# Patient Record
Sex: Female | Born: 1980 | Race: White | Hispanic: No | Marital: Married | Smoking: Former smoker
Health system: Southern US, Community
[De-identification: ages and names within clinical notes are randomized; demographics above are authoritative.]

## PROBLEM LIST (undated history)

## (undated) DIAGNOSIS — K5792 Diverticulitis of intestine, part unspecified, without perforation or abscess without bleeding: Secondary | ICD-10-CM

## (undated) DIAGNOSIS — E785 Hyperlipidemia, unspecified: Secondary | ICD-10-CM

## (undated) HISTORY — PX: WISDOM TOOTH EXTRACTION: SHX21

## (undated) HISTORY — DX: Diverticulitis of intestine, part unspecified, without perforation or abscess without bleeding: K57.92

---

## 2005-05-02 ENCOUNTER — Ambulatory Visit: Payer: Self-pay | Admitting: Obstetrics and Gynecology

## 2005-06-09 ENCOUNTER — Inpatient Hospital Stay: Payer: Self-pay | Admitting: Obstetrics and Gynecology

## 2006-04-07 ENCOUNTER — Emergency Department: Payer: Self-pay | Admitting: Internal Medicine

## 2007-12-16 ENCOUNTER — Encounter: Payer: Self-pay | Admitting: Maternal & Fetal Medicine

## 2008-03-02 ENCOUNTER — Encounter: Payer: Self-pay | Admitting: Maternal & Fetal Medicine

## 2008-05-31 ENCOUNTER — Inpatient Hospital Stay: Payer: Self-pay

## 2010-01-02 ENCOUNTER — Emergency Department: Payer: Self-pay | Admitting: Emergency Medicine

## 2010-11-03 ENCOUNTER — Emergency Department: Payer: Self-pay | Admitting: Emergency Medicine

## 2013-12-06 ENCOUNTER — Ambulatory Visit: Payer: Self-pay | Admitting: Family Medicine

## 2013-12-06 LAB — URINALYSIS, COMPLETE
Bilirubin,UR: NEGATIVE
Glucose,UR: NEGATIVE mg/dL (ref 0–75)
KETONE: NEGATIVE
Leukocyte Esterase: NEGATIVE
Nitrite: NEGATIVE
PH: 7 (ref 4.5–8.0)
SPECIFIC GRAVITY: 1.01 (ref 1.003–1.030)
WBC UR: NONE SEEN /HPF (ref 0–5)

## 2013-12-06 LAB — PREGNANCY, URINE: Pregnancy Test, Urine: NEGATIVE m[IU]/mL

## 2013-12-08 LAB — URINE CULTURE

## 2014-03-26 ENCOUNTER — Ambulatory Visit: Payer: Self-pay

## 2014-05-05 LAB — HM PAP SMEAR: HM Pap smear: NEGATIVE

## 2015-01-14 ENCOUNTER — Telehealth: Payer: Self-pay

## 2015-01-14 NOTE — Telephone Encounter (Signed)
-----   Message from Myrtis Hopping sent at 01/11/2015 10:40 AM EDT ----- Regarding: Patient requesting call from nursing services From: Samuella Cota Sent: 12/23/2014  Consultation: 12/16/2014  pt said Lorenza Burton wants her to sch. a colonoscopy [phone]

## 2015-01-14 NOTE — Telephone Encounter (Signed)
LVM for pt to return my call.

## 2015-01-18 ENCOUNTER — Other Ambulatory Visit: Payer: Self-pay

## 2015-01-18 NOTE — Telephone Encounter (Signed)
Pt scheduled for colonoscopy at [facility] on 01-21-15. Instructs/rx given.

## 2015-03-20 ENCOUNTER — Other Ambulatory Visit: Payer: Self-pay | Admitting: Unknown Physician Specialty

## 2015-04-05 ENCOUNTER — Other Ambulatory Visit: Payer: Self-pay | Admitting: Unknown Physician Specialty

## 2015-04-05 ENCOUNTER — Telehealth: Payer: Self-pay | Admitting: Unknown Physician Specialty

## 2015-04-05 MED ORDER — NORETHINDRONE ACET-ETHINYL EST 1-20 MG-MCG PO TABS: 1.0000 | ORAL_TABLET | Freq: Every day | ORAL | Status: DC

## 2015-04-05 NOTE — Telephone Encounter (Signed)
Patient called and need refill on Rx: GILDESS 1/20 1-20 MG-MCG tablet sent to [facility] in [place].

## 2015-04-05 NOTE — Telephone Encounter (Signed)
Routing to provider  

## 2015-05-02 ENCOUNTER — Other Ambulatory Visit: Payer: Self-pay | Admitting: Unknown Physician Specialty

## 2015-05-25 ENCOUNTER — Other Ambulatory Visit: Payer: Self-pay | Admitting: Unknown Physician Specialty

## 2015-06-15 ENCOUNTER — Other Ambulatory Visit: Payer: Self-pay | Admitting: Unknown Physician Specialty

## 2015-07-06 ENCOUNTER — Other Ambulatory Visit: Payer: Self-pay | Admitting: Unknown Physician Specialty

## 2015-07-16 ENCOUNTER — Emergency Department
Admission: EM | Admit: 2015-07-16 | Discharge: 2015-07-16 | Disposition: A | Discharge status: Home / Self Care | Payer: Self-pay | Attending: Emergency Medicine | Admitting: Emergency Medicine

## 2015-07-16 ENCOUNTER — Encounter: Payer: Self-pay | Admitting: Emergency Medicine

## 2015-07-16 DIAGNOSIS — J029 Acute pharyngitis, unspecified: Secondary | ICD-10-CM | POA: Insufficient documentation

## 2015-07-16 DIAGNOSIS — Z79899 Other long term (current) drug therapy: Secondary | ICD-10-CM | POA: Insufficient documentation

## 2015-07-16 LAB — POCT RAPID STREP A: Streptococcus, Group A Screen (Direct): NEGATIVE

## 2015-07-16 MED ORDER — IBUPROFEN 800 MG PO TABS: 800.0000 mg | ORAL_TABLET | Freq: Three times a day (TID) | ORAL | Status: DC | PRN

## 2015-07-16 MED ORDER — AZITHROMYCIN 250 MG PO TABS: ORAL_TABLET | ORAL | Status: DC

## 2015-07-16 NOTE — ED Notes (Signed)
Pt presents with sore throat for approximately one week. Pt states she has also had rhinorrhea. Pt states she has had fever up to 100. Pt reports having body aches.

## 2015-07-16 NOTE — ED Notes (Signed)
Sore throat fever and headache body aches since weds

## 2015-07-16 NOTE — ED Provider Notes (Signed)
[facility] Emergency Department Provider Note  ____________________________________________  Time seen: Approximately 2:22 PM  I have reviewed the triage vital signs and the nursing notes.   HISTORY  Chief Complaint Sore Throat    HPI Jene L Kleinsasser is a 34 y.o. female who presents for evaluation of sore throat for one week patient. States that she's had rhinorrhea and bodyaches and fever last night of 100F.   History reviewed. No pertinent past medical history.  There are no active problems to display for this patient.   History reviewed. No pertinent past surgical history.  Current Outpatient Rx  Name  Route  Sig  Dispense  Refill  . azithromycin (ZITHROMAX Z-PAK) 250 MG tablet      Take 2 tablets (500 mg) on  Day 1,  followed by 1 tablet (250 mg) once daily on Days 2 through 5.   6 each   0   . ibuprofen (ADVIL,MOTRIN) 800 MG tablet   Oral   Take 1 tablet (800 mg total) by mouth every 8 (eight) hours as needed.   30 tablet   0   . MICROGESTIN 1-20 MG-MCG tablet      TAKE ONE TABLET BY MOUTH ONCE DAILY   21 tablet   1     Allergies Review of patient's allergies indicates no known allergies.  No family history on file.  Social History Social History  Substance Use Topics  . Smoking status: Never Smoker   . Smokeless tobacco: None  . Alcohol Use: Yes    Review of Systems Constitutional: No fever/chills Eyes: No visual changes. ENT: No sore throat. Cardiovascular: Denies chest pain. Respiratory: Denies shortness of breath. Gastrointestinal: No abdominal pain.  No nausea, no vomiting.  No diarrhea.  No constipation. Genitourinary: Negative for dysuria. Musculoskeletal: Negative for back pain. Skin: Negative for rash. Neurological: Negative for headaches, focal weakness or numbness.  10-point ROS otherwise negative.  ____________________________________________   PHYSICAL EXAM:  VITAL SIGNS: ED Triage Vitals  Enc  Vitals Group     BP 07/16/15 1415 139/91 mmHg     Pulse Rate 07/16/15 1415 102     Resp 07/16/15 1415 20     Temp 07/16/15 1415 98.4 F (36.9 C)     Temp src --      SpO2 07/16/15 1415 98 %     Weight 07/16/15 1415 197 lb (89.359 kg)     Height 07/16/15 1415  (1.626 m)     Head Cir --      Peak Flow --      Pain Score 07/16/15 1415 7     Pain Loc --      Pain Edu? --      Excl. in GC? --     Constitutional: Alert and oriented. Well appearing and in no acute distress. Eyes: Conjunctivae are normal. PERRL. EOMI. Head: Atraumatic. Nose: No congestion/rhinnorhea. Mouth/Throat: Mucous membranes are moist.  Oropharynx erythematous with tonsillar exudate noted. Neck: No stridor.  Positive anterior cervical adenopathy noted Cardiovascular: Normal rate, regular rhythm. Grossly normal heart sounds.  Good peripheral circulation. Respiratory: Normal respiratory effort.  No retractions. Lungs CTAB. Gastrointestinal: Soft and nontender. No distention. No abdominal bruits. No CVA tenderness. Musculoskeletal: No lower extremity tenderness nor edema.  No joint effusions. Neurologic:  Normal speech and language. No gross focal neurologic deficits are appreciated. No gait instability. Skin:  Skin is warm, dry and intact. No rash noted. Psychiatric: Mood and affect are normal. Speech and behavior  are normal.  ____________________________________________   LABS (all labs ordered are listed, but only abnormal results are displayed)  Labs Reviewed  POCT RAPID STREP A     RADIOLOGY  Deferred at this visit ____________________________________________   PROCEDURES  Procedure(s) performed: None  Critical Care performed: No  ____________________________________________   INITIAL IMPRESSION / ASSESSMENT AND PLAN / ED COURSE  Pertinent labs & imaging results that were available during my care of the patient were reviewed by me and considered in my medical decision making (see chart  for details).  Acute pharyngitis. Rx given for Zithromax and Motrin 800. Patient follow-up with PCP or return to the ER with any worsening symptomology work excuse given times one day. ____________________________________________   FINAL CLINICAL IMPRESSION(S) / ED DIAGNOSES  Final diagnoses:  Acute pharyngitis, unspecified pharyngitis type      Evangeline Dakinharles M Sayge Brienza, PA-C 07/16/15 1452  Jennye MoccasinBrian S Quigley, MD 07/16/15 [phone]

## 2015-07-16 NOTE — Discharge Instructions (Signed)

## 2015-07-19 LAB — CULTURE, GROUP A STREP (THRC)

## 2015-08-21 ENCOUNTER — Other Ambulatory Visit: Payer: Self-pay | Admitting: Unknown Physician Specialty

## 2015-08-23 NOTE — Telephone Encounter (Signed)
Needs PE

## 2015-08-23 NOTE — Telephone Encounter (Signed)
Called patient to schedule appt, no answer, left VM for pt to return call and schedule appt for med fu

## 2015-08-24 NOTE — Telephone Encounter (Signed)
Called and left patient a voicemail asking for her to please return my call to schedule a visit.

## 2015-08-25 NOTE — Telephone Encounter (Signed)
Called and left patient a voicemail asking for her to please return my call.  

## 2015-08-26 NOTE — Telephone Encounter (Signed)
We have tried to reach this patient 3 times so I am sending her a letter asking for her to please call to schedule a follow-up.

## 2015-09-06 DIAGNOSIS — G43009 Migraine without aura, not intractable, without status migrainosus: Secondary | ICD-10-CM | POA: Insufficient documentation

## 2015-09-06 DIAGNOSIS — K5792 Diverticulitis of intestine, part unspecified, without perforation or abscess without bleeding: Secondary | ICD-10-CM

## 2015-09-06 DIAGNOSIS — Z3041 Encounter for surveillance of contraceptive pills: Secondary | ICD-10-CM

## 2015-09-07 ENCOUNTER — Encounter: Payer: Self-pay | Admitting: Unknown Physician Specialty

## 2015-09-07 ENCOUNTER — Ambulatory Visit (INDEPENDENT_AMBULATORY_CARE_PROVIDER_SITE_OTHER): Payer: Self-pay | Admitting: Unknown Physician Specialty

## 2015-09-07 VITALS — BP 131/87 | HR 81 | Temp 98.2°F | Ht 61.8 in | Wt 198.6 lb

## 2015-09-07 DIAGNOSIS — Z23 Encounter for immunization: Secondary | ICD-10-CM

## 2015-09-07 MED ORDER — NORETHINDRONE ACET-ETHINYL EST 1-20 MG-MCG PO TABS: 1.0000 | ORAL_TABLET | Freq: Every day | ORAL | Status: DC

## 2015-09-07 NOTE — Progress Notes (Signed)
++++++++++++++++++++++++++++++++++++  BP 131/87 mmHg  Pulse 81  Temp(Src) 98.2 F (36.8 C)  Ht 5' 1.8" (1.57 m)  Wt 198 lb 9.6 oz (90.084 kg)  BMI 36.55 kg/m2  SpO2 98%  LMP 07/28/2014 (Approximate)   Subjective:    Patient ID: Tracy Nunez, female    DOB: 1981-03-06, 35 y.o.   MRN: [id number]  HPI: Tracy Nunez is a 35 y.o. female  Chief Complaint  Patient presents with  . Medication Refill    pt states she needs a refill on BCP   Depression screen PHQ 2/9 09/07/2015  Decreased Interest 0  Down, Depressed, Hopeless 0  PHQ - 2 Score 0    Past Medical History  Diagnosis Date  . Diverticulitis    Family History  Problem Relation Age of Onset  . Cancer Mother     hodgkins lymphoma  . Heart disease Father     massive MI  . Diabetes Father   . Hypertension Father   . Migraines Father   . COPD Father   . Emphysema Father   . Migraines Sister   . Migraines Brother   . Stroke Maternal Grandmother   . Migraines Daughter   . Migraines Son   . Migraines Brother   . Migraines Brother   . Migraines Brother     Relevant past medical, surgical, family and social history reviewed and updated as indicated. Interim medical history since our last visit reviewed. Allergies and medications reviewed and updated.  Review of Systems  Constitutional: Negative.   HENT: Negative.   Eyes: Negative.   Respiratory: Negative.   Cardiovascular: Negative.   Gastrointestinal: Negative.   Endocrine: Negative.   Genitourinary: Negative.        Continuous BCPs.  No menses   Musculoskeletal: Negative.   Skin: Negative.   Allergic/Immunologic: Negative.   Neurological: Negative.   Hematological: Negative.   Psychiatric/Behavioral: Negative.     Per HPI unless specifically indicated above     Objective:    BP 131/87 mmHg  Pulse 81  Temp(Src) 98.2 F (36.8 C)  Ht 5' 1.8" (1.57 m)  Wt 198 lb 9.6 oz (90.084 kg)  BMI 36.55 kg/m2  SpO2 98%  LMP 07/28/2014 (Approximate)  Wt  Readings from Last 3 Encounters:  09/07/15 198 lb 9.6 oz (90.084 kg)  11/20/14 207 lb (93.895 kg)  07/16/15 197 lb (89.359 kg)    Physical Exam  Constitutional: She is oriented to person, place, and time. She appears well-developed and well-nourished.  HENT:  Head: Normocephalic and atraumatic.  Eyes: Pupils are equal, round, and reactive to light. Right eye exhibits no discharge. Left eye exhibits no discharge. No scleral icterus.  Neck: Normal range of motion. Neck supple. Carotid bruit is not present. No thyromegaly present.  Cardiovascular: Normal rate, regular rhythm and normal heart sounds.  Exam reveals no gallop and no friction rub.   No murmur heard. Pulmonary/Chest: Effort normal and breath sounds normal. No respiratory distress. She has no wheezes. She has no rales.  Abdominal: Soft. Bowel sounds are normal. There is no tenderness. There is no rebound.  Genitourinary: No breast swelling, tenderness or discharge.  Musculoskeletal: Normal range of motion.  Lymphadenopathy:    She has no cervical adenopathy.  Neurological: She is alert and oriented to person, place, and time.  Skin: Skin is warm, dry and intact. No rash noted.  Psychiatric: She has a normal mood and affect. Her speech is normal and behavior is normal. Judgment and thought  content normal. Cognition and memory are normal.    Results for orders placed or performed in visit on 09/06/15  HM PAP SMEAR  Result Value Ref Range   HM Pap smear negative- from PP       Assessment & Plan:   Problem List Items Addressed This Visit    None    Visit Diagnoses    Need for diphtheria-tetanus-pertussis (Tdap) vaccine, adult/adolescent    -  Primary    Relevant Orders    Tdap vaccine greater than or equal to 7yo IM (Completed)    Immunization due        Relevant Orders    Flu Vaccine QUAD 36+ mos IM (Completed)        Follow up plan: Return in about 1 year (around 09/06/2016) for physical.

## 2016-08-26 ENCOUNTER — Other Ambulatory Visit: Payer: Self-pay | Admitting: Unknown Physician Specialty

## 2016-08-28 NOTE — Telephone Encounter (Signed)
Pt needs to be seen

## 2016-08-28 NOTE — Telephone Encounter (Signed)
Patient has appointment scheduled for 09/08/16

## 2016-09-08 ENCOUNTER — Encounter: Payer: Self-pay | Admitting: Unknown Physician Specialty

## 2016-10-08 ENCOUNTER — Other Ambulatory Visit: Payer: Self-pay | Admitting: Unknown Physician Specialty

## 2016-10-09 ENCOUNTER — Telehealth: Payer: Self-pay | Admitting: Unknown Physician Specialty

## 2016-10-09 MED ORDER — NORETHINDRONE ACET-ETHINYL EST 1-20 MG-MCG PO TABS: 1.0000 | ORAL_TABLET | Freq: Every day | ORAL | 1 refills | Status: DC

## 2016-10-09 NOTE — Telephone Encounter (Signed)
Patient is completely out of her BC med --microgestin 1-20mg  mcg and needs refill sent in if possible.  She has a CPE on 10/13/2016 @ 9.     [facility] [place]   Thank You

## 2016-10-09 NOTE — Telephone Encounter (Signed)
Routing to provider. Patient has appointment scheduled 10/13/16.

## 2016-10-13 ENCOUNTER — Encounter: Payer: Self-pay | Admitting: Unknown Physician Specialty

## 2016-10-13 ENCOUNTER — Ambulatory Visit (INDEPENDENT_AMBULATORY_CARE_PROVIDER_SITE_OTHER): Payer: Self-pay | Admitting: Unknown Physician Specialty

## 2016-10-13 VITALS — BP 125/84 | HR 98 | Temp 98.2°F | Ht 63.0 in | Wt 207.1 lb

## 2016-10-13 DIAGNOSIS — E669 Obesity, unspecified: Secondary | ICD-10-CM

## 2016-10-13 DIAGNOSIS — Z0000 Encounter for general adult medical examination without abnormal findings: Secondary | ICD-10-CM

## 2016-10-13 MED ORDER — NORETHINDRONE ACET-ETHINYL EST 1-20 MG-MCG PO TABS: 1.0000 | ORAL_TABLET | Freq: Every day | ORAL | 12 refills | Status: DC

## 2016-10-13 NOTE — Progress Notes (Signed)
   BP 125/84 (BP Location: Left Arm, Patient Position: Sitting, Cuff Size: Large)   Pulse 98   Temp 98.2 F (36.8 C)   Ht 5\' 3"  (1.6 m)   Wt 207 lb 1.6 oz (93.9 kg)   LMP  (LMP Unknown)   SpO2 100%   BMI 36.69 kg/m    Subjective:    Patient ID: Tracy Nunez, female    DOB: 02/07/1981, 36 y.o.   MRN: [id number]  HPI: Tracy Nunez is a 36 y.o. female  Chief Complaint  Patient presents with  . Annual Exam    Relevant past medical, surgical, family and social history reviewed and updated as indicated. Interim medical history since our last visit reviewed. Allergies and medications reviewed and updated.  Review of Systems  Per HPI unless specifically indicated above     Objective:    BP 125/84 (BP Location: Left Arm, Patient Position: Sitting, Cuff Size: Large)   Pulse 98   Temp 98.2 F (36.8 C)   Ht 5\' 3"  (1.6 m)   Wt 207 lb 1.6 oz (93.9 kg)   LMP  (LMP Unknown)   SpO2 100%   BMI 36.69 kg/m   Wt Readings from Last 3 Encounters:  10/13/16 207 lb 1.6 oz (93.9 kg)  09/07/15 198 lb 9.6 oz (90.1 kg)  11/20/14 207 lb (93.9 kg)    Physical Exam  Constitutional: She is oriented to person, place, and time. She appears well-developed and well-nourished.  HENT:  Head: Normocephalic and atraumatic.  Eyes: Pupils are equal, round, and reactive to light. Right eye exhibits no discharge. Left eye exhibits no discharge. No scleral icterus.  Neck: Normal range of motion. Neck supple. Carotid bruit is not present. No thyromegaly present.  Cardiovascular: Normal rate, regular rhythm and normal heart sounds.  Exam reveals no gallop and no friction rub.   No murmur heard. Pulmonary/Chest: Effort normal and breath sounds normal. No respiratory distress. She has no wheezes. She has no rales.  Abdominal: Soft. Bowel sounds are normal. There is no tenderness. There is no rebound.  Genitourinary: No breast swelling, tenderness or discharge.  Musculoskeletal: Normal range of motion.    Lymphadenopathy:    She has no cervical adenopathy.  Neurological: She is alert and oriented to person, place, and time.  Skin: Skin is warm, dry and intact. No rash noted.  Psychiatric: She has a normal mood and affect. Her speech is normal and behavior is normal. Judgment and thought content normal. Cognition and memory are normal.      Assessment & Plan:   Problem List Items Addressed This Visit      Unprioritized   Obesity (BMI 35.0-39.9 without comorbidity)    Log calories and activity into "My Fitness Pal."  Diet review shows she eats well.  Consider checking thyroid       Other Visit Diagnoses    Routine general medical examination at a health care facility    -  Primary       Follow up plan: Return in about 1 year (around 10/13/2017).

## 2016-10-13 NOTE — Assessment & Plan Note (Addendum)
Log calories and activity into "My Fitness Pal."  Diet review shows she eats well.  Consider checking thyroid

## 2016-11-07 ENCOUNTER — Encounter: Payer: Self-pay | Admitting: Unknown Physician Specialty

## 2017-10-15 ENCOUNTER — Encounter: Payer: Self-pay | Admitting: Unknown Physician Specialty

## 2017-10-20 ENCOUNTER — Other Ambulatory Visit: Payer: Self-pay

## 2017-10-20 ENCOUNTER — Ambulatory Visit
Admission: EM | Admit: 2017-10-20 | Discharge: 2017-10-20 | Disposition: A | Discharge status: Home / Self Care | Payer: Self-pay | Attending: Family Medicine | Admitting: Family Medicine

## 2017-10-20 ENCOUNTER — Encounter: Payer: Self-pay | Admitting: Gynecology

## 2017-10-20 DIAGNOSIS — J029 Acute pharyngitis, unspecified: Secondary | ICD-10-CM

## 2017-10-20 DIAGNOSIS — R6883 Chills (without fever): Secondary | ICD-10-CM

## 2017-10-20 DIAGNOSIS — R509 Fever, unspecified: Secondary | ICD-10-CM

## 2017-10-20 DIAGNOSIS — J020 Streptococcal pharyngitis: Secondary | ICD-10-CM

## 2017-10-20 LAB — RAPID INFLUENZA A&B ANTIGENS (ARMC ONLY): INFLUENZA B (ARMC): NEGATIVE

## 2017-10-20 LAB — RAPID INFLUENZA A&B ANTIGENS: Influenza A (ARMC): NEGATIVE

## 2017-10-20 LAB — RAPID STREP SCREEN (MED CTR MEBANE ONLY): Streptococcus, Group A Screen (Direct): POSITIVE — AB

## 2017-10-20 MED ORDER — AMOXICILLIN 500 MG PO TABS: 500.0000 mg | ORAL_TABLET | Freq: Two times a day (BID) | ORAL | 0 refills | Status: DC

## 2017-10-20 NOTE — ED Triage Notes (Signed)
Per patient c/o ear pain/ sore throat / feve x 3 days.

## 2017-10-20 NOTE — Discharge Instructions (Signed)
Rest.  Tylenol and Ibuprofen as needed.  Antibiotic as prescribed.  Dr. Adriana Simasook

## 2017-10-20 NOTE — ED Provider Notes (Signed)
[facility]  CSN: [id number] Arrival date & time: 10/20/17  0913  History   Chief Complaint Chief Complaint  Patient presents with  . Sore Throat  . Otalgia   HPI  37 year old female presents with fever, chills, sore throat, ear pain.  Patient states that she is been sick since Thursday.  Started abruptly with fever and chills.  She continues to have fever and chills.  Also has associated ear pain, headache, neck pain, and sore throat.  Reports body aches as well.  She is also had a bout of nausea and vomiting.  She is taken over-the-counter DayQuil and TheraFlu without improvement.  No known exacerbating factors.  No recent sick contacts.  No other associated symptoms.  No other complaints.  Past Medical History:  Diagnosis Date  . Diverticulitis    Patient Active Problem List   Diagnosis Date Noted  . Obesity (BMI 35.0-39.9 without comorbidity) 10/13/2016  . Encounter for surveillance of contraceptive pills 09/06/2015  . Migraine headache without aura 09/06/2015  . Diverticulitis 09/06/2015   OB History    No data available     Home Medications    Prior to Admission medications   Medication Sig Start Date End Date Taking? Authorizing Provider  Multiple Vitamin (MULTIVITAMIN) tablet Take 1 tablet by mouth daily.   Yes [provider]  amoxicillin (AMOXIL) 500 MG tablet Take 1 tablet (500 mg total) by mouth 2 (two) times daily. 10/20/17   Tommie Samsook, Faheem Ziemann G, DO    Family History Family History  Problem Relation Age of Onset  . Cancer Mother        hodgkins lymphoma  . Heart disease Father        massive MI  . Diabetes Father   . Hypertension Father   . Migraines Father   . COPD Father   . Emphysema Father   . Migraines Sister   . Migraines Brother   . Stroke Maternal Grandmother   . Migraines Daughter   . Migraines Son   . Migraines Brother   . Migraines Brother   . Migraines Brother     Social History Social History   Tobacco Use  .  Smoking status: Former Smoker    Last attempt to quit: 01/05/2006    Years since quitting: 11.7  . Smokeless tobacco: Never Used  Substance Use Topics  . Alcohol use: No    Comment: pt states maybe once every 3 months   . Drug use: No     Allergies   Patient has no known allergies.   Review of Systems Review of Systems  Constitutional: Positive for chills and fever.  HENT: Positive for ear pain and sore throat.   Musculoskeletal: Positive for neck pain.       Body aches.  Neurological: Positive for headaches.   Physical Exam Triage Vital Signs ED Triage Vitals  Enc Vitals Group     BP 10/20/17 0937 122/76     Pulse Rate 10/20/17 0937 (!) 117     Resp --      Temp 10/20/17 0937 (!) 102.8 F (39.3 C)     Temp Source 10/20/17 0937 Oral     SpO2 10/20/17 0937 100 %     Weight 10/20/17 0939 196 lb (88.9 kg)     Height 10/20/17 0939 5\' 3"  (1.6 m)     Head Circumference --      Peak Flow --      Pain Score 10/20/17 0938 7  Pain Loc --      Pain Edu? --      Excl. in GC? --   Updated Vital Signs BP 122/76 (BP Location: Right Arm)   Pulse (!) 117   Temp (!) 102.8 F (39.3 C) (Oral)   Ht 5\' 3"  (1.6 m)   Wt 196 lb (88.9 kg)   LMP 09/08/2017   SpO2 100%   BMI 34.72 kg/m   Physical Exam  Constitutional: She is oriented to person, place, and time. She appears well-developed. No distress.  HENT:  Head: Normocephalic and atraumatic.  Mouth/Throat: Uvula is midline. Posterior oropharyngeal erythema present. No tonsillar abscesses. Tonsils are 2+ on the right. Tonsils are 2+ on the left. Tonsillar exudate.  TMs normal bilaterally.   Eyes: Conjunctivae are normal. Right eye exhibits no discharge. Left eye exhibits no discharge.  Neck: Neck supple.  Cardiovascular: Regular rhythm.  Tachycardic.  Pulmonary/Chest: Effort normal and breath sounds normal. She has no wheezes. She has no rales.  Lymphadenopathy:    She has cervical adenopathy.  Neurological: She is alert  and oriented to person, place, and time.  Psychiatric: She has a normal mood and affect. Her behavior is normal.  Nursing note and vitals reviewed.  UC Treatments / Results  Labs (all labs ordered are listed, but only abnormal results are displayed) Labs Reviewed  RAPID STREP SCREEN (NOT AT [facility]) - Abnormal; Notable for the following components:      Result Value   Streptococcus, Group A Screen (Direct) POSITIVE (*)    All other components within normal limits  RAPID INFLUENZA A&B ANTIGENS ([facility] ONLY)    EKG  EKG Interpretation None       Radiology No results found.  Procedures Procedures (including critical care time)  Medications Ordered in UC Medications - No data to display   Initial Impression / Assessment and Plan / UC Course  I have reviewed the triage vital signs and the nursing notes.  Pertinent labs & imaging results that were available during my care of the patient were reviewed by me and considered in my medical decision making (see chart for details).    37 year old female presents with strep pharyngitis.  Rapid strep was positive.  Flu is negative.  Treating with amoxicillin.  Ibuprofen and Tylenol as needed.  Supportive care.  Final Clinical Impressions(s) / UC Diagnoses   Final diagnoses:  Strep pharyngitis    ED Discharge Orders        Ordered    amoxicillin (AMOXIL) 500 MG tablet  2 times daily     10/20/17 1014     Controlled Substance Prescriptions [place] Controlled Substance Registry consulted? Not Applicable   Tommie Sams, DO 10/20/17 1016

## 2017-11-06 ENCOUNTER — Encounter: Payer: Self-pay | Admitting: Unknown Physician Specialty

## 2017-11-06 ENCOUNTER — Ambulatory Visit (INDEPENDENT_AMBULATORY_CARE_PROVIDER_SITE_OTHER): Payer: Self-pay | Admitting: Unknown Physician Specialty

## 2017-11-06 VITALS — BP 117/78 | HR 75 | Temp 98.7°F | Ht 61.7 in | Wt 194.6 lb

## 2017-11-06 DIAGNOSIS — Z0000 Encounter for general adult medical examination without abnormal findings: Secondary | ICD-10-CM

## 2017-11-06 NOTE — Patient Instructions (Signed)
Preventive Care 18-39 Years, Female Preventive care refers to lifestyle choices and visits with your health care provider that can promote health and wellness. What does preventive care include?  A yearly physical exam. This is also called an annual well check.  Dental exams once or twice a year.  Routine eye exams. Ask your health care provider how often you should have your eyes checked.  Personal lifestyle choices, including: ? Daily care of your teeth and gums. ? Regular physical activity. ? Eating a healthy diet. ? Avoiding tobacco and drug use. ? Limiting alcohol use. ? Practicing safe sex. ? Taking vitamin and mineral supplements as recommended by your health care provider. What happens during an annual well check? The services and screenings done by your health care provider during your annual well check will depend on your age, overall health, lifestyle risk factors, and family history of disease. Counseling Your health care provider may ask you questions about your:  Alcohol use.  Tobacco use.  Drug use.  Emotional well-being.  Home and relationship well-being.  Sexual activity.  Eating habits.  Work and work Statistician.  Method of birth control.  Menstrual cycle.  Pregnancy history.  Screening You may have the following tests or measurements:  Height, weight, and BMI.  Diabetes screening. This is done by checking your blood sugar (glucose) after you have not eaten for a while (fasting).  Blood pressure.  Lipid and cholesterol levels. These may be checked every 5 years starting at age 66.  Skin check.  Hepatitis C blood test.  Hepatitis B blood test.  Sexually transmitted disease (STD) testing.  BRCA-related cancer screening. This may be done if you have a family history of breast, ovarian, tubal, or peritoneal cancers.  Pelvic exam and Pap test. This may be done every 3 years starting at age 40. Starting at age 59, this may be done every 5  years if you have a Pap test in combination with an HPV test.  Discuss your test results, treatment options, and if necessary, the need for more tests with your health care provider. Vaccines Your health care provider may recommend certain vaccines, such as:  Influenza vaccine. This is recommended every year.  Tetanus, diphtheria, and acellular pertussis (Tdap, Td) vaccine. You may need a Td booster every 10 years.  Varicella vaccine. You may need this if you have not been vaccinated.  HPV vaccine. If you are 69 or younger, you may need three doses over 6 months.  Measles, mumps, and rubella (MMR) vaccine. You may need at least one dose of MMR. You may also need a second dose.  Pneumococcal 13-valent conjugate (PCV13) vaccine. You may need this if you have certain conditions and were not previously vaccinated.  Pneumococcal polysaccharide (PPSV23) vaccine. You may need one or two doses if you smoke cigarettes or if you have certain conditions.  Meningococcal vaccine. One dose is recommended if you are age 27-21 years and a first-year college student living in a residence hall, or if you have one of several medical conditions. You may also need additional booster doses.  Hepatitis A vaccine. You may need this if you have certain conditions or if you travel or work in places where you may be exposed to hepatitis A.  Hepatitis B vaccine. You may need this if you have certain conditions or if you travel or work in places where you may be exposed to hepatitis B.  Haemophilus influenzae type b (Hib) vaccine. You may need this if  you have certain risk factors.  Talk to your health care provider about which screenings and vaccines you need and how often you need them. This information is not intended to replace advice given to you by your health care provider. Make sure you discuss any questions you have with your health care provider. Document Released: 09/19/2001 Document Revised: 04/12/2016  Document Reviewed: 05/25/2015 Elsevier Interactive Patient Education  [employer].

## 2017-11-06 NOTE — Progress Notes (Signed)
BP 117/78   Pulse 75   Temp 98.7 F (37.1 C) (Oral)   Ht 5' 1.7" (1.567 m)   Wt 194 lb 9.6 oz (88.3 kg)   LMP 10/29/2017 (Exact Date)   SpO2 98%   BMI 35.94 kg/m    Subjective:    Patient ID: Tracy Nunez, female    DOB: 06/27/1981, 37 y.o.   MRN: [id number]  HPI: Tracy Nunez is a 37 y.o. female  Chief Complaint  Patient presents with  . Annual Exam   Voluntarily stopped taking her BC.  States she is OK with getting pregnant.  She had a regular period last Monday.    Obesity Steadily losing weight.  She is going to boot camp and running.  Her sister helps motivate her.  Eating better by cutting out soft drinks and rarely eats sweets.    [facility] 11/06/2017 10/13/2016 09/07/2015  Decreased Interest 0 0 0  Down, Depressed, Hopeless 0 0 0  PHQ - 2 Score 0 0 0     Social History   Socioeconomic History  . Marital status: Married    Spouse name: Not on file  . Number of children: Not on file  . Years of education: Not on file  . Highest education level: Not on file  Occupational History  . Not on file  Social Needs  . Financial resource strain: Not on file  . Food insecurity:    Worry: Not on file    Inability: Not on file  . Transportation needs:    Medical: Not on file    Non-medical: Not on file  Tobacco Use  . Smoking status: Former Smoker    Last attempt to quit: 01/05/2006    Years since quitting: 11.8  . Smokeless tobacco: Never Used  Substance and Sexual Activity  . Alcohol use: No    Comment: pt states maybe once every 3 months   . Drug use: No  . Sexual activity: Yes  Lifestyle  . Physical activity:    Days per week: Not on file    Minutes per session: Not on file  . Stress: Not on file  Relationships  . Social connections:    Talks on phone: Not on file    Gets together: Not on file    Attends religious service: Not on file    Active member of club or organization: Not on file    Attends meetings of clubs or organizations: Not  on file    Relationship status: Not on file  . Intimate partner violence:    Fear of current or ex partner: Not on file    Emotionally abused: Not on file    Physically abused: Not on file    Forced sexual activity: Not on file  Other Topics Concern  . Not on file  Social History Narrative  . Not on file   Family History  Problem Relation Age of Onset  . Cancer Mother        hodgkins lymphoma  . Heart disease Father        massive MI  . Diabetes Father   . Hypertension Father   . Migraines Father   . COPD Father   . Emphysema Father   . Migraines Sister   . Migraines Brother   . Stroke Maternal Grandmother   . Migraines Daughter   . Migraines Son   . Migraines Brother   . Migraines Brother   . Migraines Brother  Past Medical History:  Diagnosis Date  . Diverticulitis    History reviewed. No pertinent surgical history.  Relevant past medical, surgical, family and social history reviewed and updated as indicated. Interim medical history since our last visit reviewed. Allergies and medications reviewed and updated.  Review of Systems  Constitutional: Negative.   HENT: Negative.   Eyes: Negative.   Respiratory: Negative.   Cardiovascular: Negative.   Gastrointestinal: Negative.   Endocrine: Negative.   Genitourinary: Negative.   Musculoskeletal: Negative.   Skin: Negative.   Allergic/Immunologic: Negative.   Neurological: Negative.   Hematological: Negative.   Psychiatric/Behavioral: Negative.     Per HPI unless specifically indicated above     Objective:    BP 117/78   Pulse 75   Temp 98.7 F (37.1 C) (Oral)   Ht 5' 1.7" (1.567 m)   Wt 194 lb 9.6 oz (88.3 kg)   LMP 10/29/2017 (Exact Date)   SpO2 98%   BMI 35.94 kg/m   Wt Readings from Last 3 Encounters:  11/06/17 194 lb 9.6 oz (88.3 kg)  10/20/17 196 lb (88.9 kg)  10/13/16 207 lb 1.6 oz (93.9 kg)    Physical Exam  Constitutional: She is oriented to person, place, and time. She appears  well-developed and well-nourished. No distress.  HENT:  Head: Normocephalic and atraumatic.  Eyes: Pupils are equal, round, and reactive to light. Conjunctivae and lids are normal. Right eye exhibits no discharge. Left eye exhibits no discharge. No scleral icterus.  Neck: Normal range of motion. Neck supple. No JVD present. Carotid bruit is not present. No thyromegaly present.  Cardiovascular: Normal rate, regular rhythm and normal heart sounds. Exam reveals no gallop and no friction rub.  No murmur heard. Pulmonary/Chest: Effort normal and breath sounds normal. No respiratory distress. She has no wheezes. She has no rales. No breast swelling, tenderness or discharge.  Abdominal: Soft. Normal appearance and bowel sounds are normal. There is no splenomegaly or hepatomegaly. There is no tenderness. There is no rebound.  Genitourinary: Vagina normal and uterus normal. Cervix exhibits no motion tenderness, no discharge and no friability. Right adnexum displays no mass, no tenderness and no fullness. Left adnexum displays no mass, no tenderness and no fullness.  Musculoskeletal: Normal range of motion.  Lymphadenopathy:    She has no cervical adenopathy.  Neurological: She is alert and oriented to person, place, and time.  Skin: Skin is warm, dry and intact. No rash noted. No pallor.  Psychiatric: She has a normal mood and affect. Her speech is normal and behavior is normal. Judgment and thought content normal. Cognition and memory are normal.    Results for orders placed or performed during the hospital encounter of 10/20/17  Rapid Influenza A&B Antigens ([facility] only)  Result Value Ref Range   Influenza A ([facility]) NEGATIVE NEGATIVE   Influenza B ([facility]) NEGATIVE NEGATIVE  Rapid strep screen  Result Value Ref Range   Streptococcus, Group A Screen (Direct) POSITIVE (A) NEGATIVE      Assessment & Plan:   Problem List Items Addressed This Visit    None    Visit Diagnoses    Annual physical exam     -  Primary   Relevant Orders   Lipid Panel w/o Chol/HDL Ratio   Comprehensive metabolic panel   CBC with Differential/Platelet   TSH   IGP, Aptima HPV, rfx 16/18,45      HM Pap done Td due 2027  Follow up plan: Return in about 1 year (around  11/07/2018).      

## 2017-11-07 ENCOUNTER — Encounter: Payer: Self-pay | Admitting: Unknown Physician Specialty

## 2017-11-07 LAB — CBC WITH DIFFERENTIAL/PLATELET
BASOS ABS: 0 10*3/uL (ref 0.0–0.2)
Basos: 1 %
EOS (ABSOLUTE): 0.2 10*3/uL (ref 0.0–0.4)
Eos: 3 %
Hematocrit: 40.7 % (ref 34.0–46.6)
Hemoglobin: 13.8 g/dL (ref 11.1–15.9)
IMMATURE GRANS (ABS): 0 10*3/uL (ref 0.0–0.1)
IMMATURE GRANULOCYTES: 0 %
LYMPHS: 36 %
Lymphocytes Absolute: 2.5 10*3/uL (ref 0.7–3.1)
MCH: 29.9 pg (ref 26.6–33.0)
MCHC: 33.9 g/dL (ref 31.5–35.7)
MCV: 88 fL (ref 79–97)
MONOS ABS: 0.6 10*3/uL (ref 0.1–0.9)
Monocytes: 9 %
Neutrophils Absolute: 3.7 10*3/uL (ref 1.4–7.0)
Neutrophils: 51 %
PLATELETS: 282 10*3/uL (ref 150–379)
RBC: 4.62 x10E6/uL (ref 3.77–5.28)
RDW: 13.5 % (ref 12.3–15.4)
WBC: 7.1 10*3/uL (ref 3.4–10.8)

## 2017-11-07 LAB — LIPID PANEL W/O CHOL/HDL RATIO
Cholesterol, Total: 134 mg/dL (ref 100–199)
HDL: 37 mg/dL — AB (ref >39)
LDL CALC: 55 mg/dL (ref 0–99)
Triglycerides: 209 mg/dL — ABNORMAL HIGH (ref 0–149)
VLDL Cholesterol Cal: 42 mg/dL — ABNORMAL HIGH (ref 5–40)

## 2017-11-07 LAB — COMPREHENSIVE METABOLIC PANEL
A/G RATIO: 1.5 (ref 1.2–2.2)
ALK PHOS: 92 IU/L (ref 39–117)
ALT: 16 IU/L (ref 0–32)
AST: 16 IU/L (ref 0–40)
Albumin: 4.1 g/dL (ref 3.5–5.5)
BUN/Creatinine Ratio: 22 (ref 9–23)
BUN: 14 mg/dL (ref 6–20)
Bilirubin Total: 0.3 mg/dL (ref 0.0–1.2)
CALCIUM: 9.2 mg/dL (ref 8.7–10.2)
CO2: 27 mmol/L (ref 20–29)
Chloride: 100 mmol/L (ref 96–106)
Creatinine, Ser: 0.65 mg/dL (ref 0.57–1.00)
GFR calc Af Amer: 133 mL/min/{1.73_m2} (ref >59)
GFR, EST NON AFRICAN AMERICAN: 115 mL/min/{1.73_m2} (ref >59)
GLOBULIN, TOTAL: 2.8 g/dL (ref 1.5–4.5)
Glucose: 72 mg/dL (ref 65–99)
POTASSIUM: 4.3 mmol/L (ref 3.5–5.2)
SODIUM: 140 mmol/L (ref 134–144)
Total Protein: 6.9 g/dL (ref 6.0–8.5)

## 2017-11-07 LAB — TSH: TSH: 1.48 u[IU]/mL (ref 0.450–4.500)

## 2017-11-08 LAB — IGP, APTIMA HPV, RFX 16/18,45
HPV APTIMA: NEGATIVE
PAP SMEAR COMMENT: 0

## 2018-11-12 ENCOUNTER — Encounter: Payer: Self-pay | Admitting: Unknown Physician Specialty

## 2018-11-12 ENCOUNTER — Encounter: Payer: Self-pay | Admitting: Nurse Practitioner

## 2018-12-31 DIAGNOSIS — O09512 Supervision of elderly primigravida, second trimester: Secondary | ICD-10-CM | POA: Diagnosis not present

## 2018-12-31 DIAGNOSIS — Z362 Encounter for other antenatal screening follow-up: Secondary | ICD-10-CM | POA: Diagnosis not present

## 2019-01-20 DIAGNOSIS — O99810 Abnormal glucose complicating pregnancy: Secondary | ICD-10-CM | POA: Diagnosis not present

## 2019-01-20 DIAGNOSIS — O09523 Supervision of elderly multigravida, third trimester: Secondary | ICD-10-CM | POA: Diagnosis not present

## 2019-01-20 DIAGNOSIS — O99213 Obesity complicating pregnancy, third trimester: Secondary | ICD-10-CM | POA: Diagnosis not present

## 2019-01-27 DIAGNOSIS — O99810 Abnormal glucose complicating pregnancy: Secondary | ICD-10-CM | POA: Diagnosis not present

## 2019-02-18 DIAGNOSIS — O99810 Abnormal glucose complicating pregnancy: Secondary | ICD-10-CM | POA: Diagnosis not present

## 2019-03-13 DIAGNOSIS — Z79899 Other long term (current) drug therapy: Secondary | ICD-10-CM | POA: Diagnosis not present

## 2019-03-13 DIAGNOSIS — Z3A35 35 weeks gestation of pregnancy: Secondary | ICD-10-CM | POA: Diagnosis not present

## 2019-03-13 DIAGNOSIS — O24419 Gestational diabetes mellitus in pregnancy, unspecified control: Secondary | ICD-10-CM | POA: Diagnosis not present

## 2019-03-17 DIAGNOSIS — O09513 Supervision of elderly primigravida, third trimester: Secondary | ICD-10-CM | POA: Diagnosis not present

## 2019-03-17 DIAGNOSIS — Z3A36 36 weeks gestation of pregnancy: Secondary | ICD-10-CM | POA: Diagnosis not present

## 2019-03-17 DIAGNOSIS — O24410 Gestational diabetes mellitus in pregnancy, diet controlled: Secondary | ICD-10-CM | POA: Diagnosis not present

## 2019-03-17 DIAGNOSIS — O24419 Gestational diabetes mellitus in pregnancy, unspecified control: Secondary | ICD-10-CM | POA: Diagnosis not present

## 2019-04-09 DIAGNOSIS — O24420 Gestational diabetes mellitus in childbirth, diet controlled: Secondary | ICD-10-CM | POA: Diagnosis not present

## 2019-04-09 DIAGNOSIS — Z3A39 39 weeks gestation of pregnancy: Secondary | ICD-10-CM | POA: Diagnosis not present

## 2020-01-09 DIAGNOSIS — R399 Unspecified symptoms and signs involving the genitourinary system: Secondary | ICD-10-CM | POA: Diagnosis not present

## 2020-01-09 DIAGNOSIS — Z30011 Encounter for initial prescription of contraceptive pills: Secondary | ICD-10-CM | POA: Diagnosis not present

## 2020-01-09 DIAGNOSIS — N898 Other specified noninflammatory disorders of vagina: Secondary | ICD-10-CM | POA: Diagnosis not present

## 2020-01-09 DIAGNOSIS — Z3046 Encounter for surveillance of implantable subdermal contraceptive: Secondary | ICD-10-CM | POA: Diagnosis not present

## 2020-05-17 DIAGNOSIS — H5213 Myopia, bilateral: Secondary | ICD-10-CM | POA: Diagnosis not present

## 2020-06-03 DIAGNOSIS — H5213 Myopia, bilateral: Secondary | ICD-10-CM | POA: Diagnosis not present

## 2021-05-02 ENCOUNTER — Other Ambulatory Visit: Payer: Self-pay

## 2021-05-02 ENCOUNTER — Ambulatory Visit
Admission: EM | Admit: 2021-05-02 | Discharge: 2021-05-02 | Disposition: A | Discharge status: Other Acute Inpatient Hospital | Payer: Medicaid Other

## 2021-05-02 DIAGNOSIS — R1032 Left lower quadrant pain: Secondary | ICD-10-CM

## 2021-05-02 NOTE — ED Provider Notes (Signed)
[facility]    CSN: [id number] Arrival date & time: 05/02/21  1828      History   Chief Complaint Chief Complaint  Patient presents with   Abdominal Pain    HPI Tracy Nunez is a 40 y.o. female who woke up with L mid abd pain radiating to LLQ. Feels like when she had diverticulitis in the past. Started having a fever of 101 2 hours ago. She took Tylenol before she came.    Past Medical History:  Diagnosis Date   Diverticulitis     Patient Active Problem List   Diagnosis Date Noted   Obesity (BMI 35.0-39.9 without comorbidity) 10/13/2016   Encounter for surveillance of contraceptive pills 09/06/2015   Migraine headache without aura 09/06/2015   Diverticulitis 09/06/2015    No past surgical history on file.  OB History   No obstetric history on file.      Home Medications    Prior to Admission medications   Medication Sig Start Date End Date Taking? Authorizing Provider  gemfibrozil (LOPID) 600 MG tablet Take 600 mg by mouth 2 (two) times daily. 03/14/21  Yes [provider]  Multiple Vitamin (MULTIVITAMIN) tablet Take 1 tablet by mouth daily.   Yes [provider]  norethindrone (MICRONOR) 0.35 MG tablet Take 1 tablet by mouth every morning. 01/03/21 01/03/22 Yes [provider]    Family History Family History  Problem Relation Age of Onset   Cancer Mother        hodgkins lymphoma   Heart disease Father        massive MI   Diabetes Father    Hypertension Father    Migraines Father    COPD Father    Emphysema Father    Migraines Sister    Migraines Brother    Stroke Maternal Grandmother    Migraines Daughter    Migraines Son    Migraines Brother    Migraines Brother    Migraines Brother     Social History Social History   Tobacco Use   Smoking status: Former    Types: Cigarettes    Quit date: 01/05/2006    Years since quitting: 15.3   Smokeless tobacco: Never  Vaping Use   Vaping Use: Never used   Substance Use Topics   Alcohol use: No    Comment: pt states maybe once every 3 months    Drug use: No     Allergies   Patient has no known allergies.   Review of Systems Review of Systems  Constitutional:  Positive for fever.  Gastrointestinal:  Positive for abdominal pain. Negative for diarrhea and vomiting.  Genitourinary:  Negative for difficulty urinating and dysuria.  Musculoskeletal:  Negative for myalgias.  Skin:  Negative for rash.    Physical Exam Triage Vital Signs ED Triage Vitals  Enc Vitals Group     BP 05/02/21 1930 113/87     Pulse Rate 05/02/21 1930 (!) 106     Resp 05/02/21 1930 18     Temp 05/02/21 1930 99.2 F (37.3 C)     Temp Source 05/02/21 1930 Oral     SpO2 05/02/21 1930 100 %     Weight 05/02/21 1927 215 lb (97.5 kg)     Height 05/02/21 1927 5\' 3"  (1.6 m)     Head Circumference --      Peak Flow --      Pain Score 05/02/21 1927 8     Pain Loc --  Pain Edu? --      Excl. in GC? --    No data found.  Updated Vital Signs BP 113/87 (BP Location: Left Arm)   Pulse (!) 106   Temp 99.2 F (37.3 C) (Oral)   Resp 18   Ht 5\' 3"  (1.6 m)   Wt 215 lb (97.5 kg)   SpO2 100%   BMI 38.09 kg/m   Visual Acuity Right Eye Distance:   Left Eye Distance:   Bilateral Distance:    Right Eye Near:   Left Eye Near:    Bilateral Near:     Physical Exam Constitutional:      General: She is not in acute distress.    Appearance: She is obese. She is not toxic-appearing.  HENT:     Head: Normocephalic.  Eyes:     General: No scleral icterus.    Pupils: Pupils are equal, round, and reactive to light.  Pulmonary:     Effort: Pulmonary effort is normal.  Abdominal:     General: Bowel sounds are decreased.     Palpations: Abdomen is soft. There is no hepatomegaly or mass.     Tenderness: There is abdominal tenderness in the left lower quadrant. There is guarding.  Skin:    General: Skin is warm and dry.     Findings: No rash.  Neurological:      Mental Status: She is alert and oriented to person, place, and time.  Psychiatric:        Mood and Affect: Mood normal.        Behavior: Behavior normal.     UC Treatments / Results  Labs (all labs ordered are listed, but only abnormal results are displayed) Labs Reviewed - No data to display  EKG   Radiology No results found.  Procedures Procedures (including critical care time)  Medications Ordered in UC Medications - No data to display  Initial Impression / Assessment and Plan / UC Course  I have reviewed the triage vital signs and the nursing notes. Has acute LLQ pain. She was sent to ER for work up since we dont have lab or CT.      Final diagnoses:  None   Discharge Instructions   None    ED Prescriptions   None    PDMP not reviewed this encounter.   , Garey Ham 05/02/21 1953

## 2021-05-02 NOTE — ED Triage Notes (Signed)
Pt here with C/O diverticulitis flare up since this morning. Started running a fever 1-2 hours ago.

## 2021-08-25 ENCOUNTER — Other Ambulatory Visit: Payer: Self-pay

## 2021-08-25 ENCOUNTER — Ambulatory Visit
Admission: EM | Admit: 2021-08-25 | Discharge: 2021-08-25 | Disposition: A | Discharge status: Home / Self Care | Payer: Medicaid Other | Attending: Emergency Medicine | Admitting: Emergency Medicine

## 2021-08-25 ENCOUNTER — Ambulatory Visit (INDEPENDENT_AMBULATORY_CARE_PROVIDER_SITE_OTHER): Payer: Medicaid Other

## 2021-08-25 DIAGNOSIS — S92515A Nondisplaced fracture of proximal phalanx of left lesser toe(s), initial encounter for closed fracture: Secondary | ICD-10-CM

## 2021-08-25 MED ORDER — KETOROLAC TROMETHAMINE 60 MG/2ML IM SOLN
60.0000 mg | Freq: Once | INTRAMUSCULAR | Status: AC
  Administered 2021-08-25: 60 mg via INTRAMUSCULAR

## 2021-08-25 MED ORDER — KETOROLAC TROMETHAMINE 10 MG PO TABS: 10.0000 mg | ORAL_TABLET | Freq: Four times a day (QID) | ORAL | 0 refills | Status: DC | PRN

## 2021-08-25 MED ORDER — TRAMADOL HCL 50 MG PO TABS: 100.0000 mg | ORAL_TABLET | Freq: Four times a day (QID) | ORAL | 0 refills | Status: DC | PRN

## 2021-08-25 NOTE — ED Provider Notes (Signed)
[facility]    CSN: [id number] Arrival date & time: 08/25/21  7001      History   Chief Complaint Chief Complaint  Patient presents with   Foot Pain    HPI Tracy Nunez is a 41 y.o. female.   HPI  41 year old female here for evaluation of left toe pain.  Patient reports that about 9:00 last night she kicked a piece of furniture in her home with her left fourth toe.  She states that after her injury the toe was at an odd angle.  She soaked in hot tub and manipulated the toe and heard a pop when she moved.  She then applied ice and took 600 mg of ibuprofen with minimal relief of pain.  She states that when she is just sitting her pain is about a 6/10 but increases to a 9 or higher with weightbearing.  She also is feels some numbness in the toe and has limited range of motion.  Past Medical History:  Diagnosis Date   Diverticulitis     Patient Active Problem List   Diagnosis Date Noted   Obesity (BMI 35.0-39.9 without comorbidity) 10/13/2016   Encounter for surveillance of contraceptive pills 09/06/2015   Migraine headache without aura 09/06/2015   Diverticulitis 09/06/2015    History reviewed. No pertinent surgical history.  OB History   No obstetric history on file.      Home Medications    Prior to Admission medications   Medication Sig Start Date End Date Taking? Authorizing Provider  ketorolac (TORADOL) 10 MG tablet Take 1 tablet (10 mg total) by mouth every 6 (six) hours as needed. 08/25/21  Yes Becky Augusta, NP  traMADol (ULTRAM) 50 MG tablet Take 2 tablets (100 mg total) by mouth every 6 (six) hours as needed. 08/25/21  Yes Becky Augusta, NP  gemfibrozil (LOPID) 600 MG tablet Take 600 mg by mouth 2 (two) times daily. 03/14/21   [provider]  Multiple Vitamin (MULTIVITAMIN) tablet Take 1 tablet by mouth daily.    [provider]  norethindrone (MICRONOR) 0.35 MG tablet Take 1 tablet by mouth every morning. 01/03/21 01/03/22   [provider]    Family History Family History  Problem Relation Age of Onset   Cancer Mother        hodgkins lymphoma   Heart disease Father        massive MI   Diabetes Father    Hypertension Father    Migraines Father    COPD Father    Emphysema Father    Migraines Sister    Migraines Brother    Stroke Maternal Grandmother    Migraines Daughter    Migraines Son    Migraines Brother    Migraines Brother    Migraines Brother     Social History Social History   Tobacco Use   Smoking status: Former    Types: Cigarettes    Quit date: 01/05/2006    Years since quitting: 15.6   Smokeless tobacco: Never  Vaping Use   Vaping Use: Never used  Substance Use Topics   Alcohol use: No    Comment: pt states maybe once every 3 months    Drug use: No     Allergies   Patient has no known allergies.   Review of Systems Review of Systems  Musculoskeletal:  Positive for arthralgias and joint swelling.  Skin:  Positive for color change.  Neurological:  Positive for numbness.    Physical  Exam Triage Vital Signs ED Triage Vitals  Enc Vitals Group     BP 08/25/21 0834 133/86     Pulse Rate 08/25/21 0834 84     Resp 08/25/21 0834 18     Temp 08/25/21 0834 99 F (37.2 C)     Temp Source 08/25/21 0834 Oral     SpO2 08/25/21 0834 99 %     Weight --      Height --      Head Circumference --      Peak Flow --      Pain Score 08/25/21 0833 8     Pain Loc --      Pain Edu? --      Excl. in GC? --    No data found.  Updated Vital Signs BP 133/86 (BP Location: Left Arm)    Pulse 84    Temp 99 F (37.2 C) (Oral)    Resp 18    SpO2 99%   Visual Acuity Right Eye Distance:   Left Eye Distance:   Bilateral Distance:    Right Eye Near:   Left Eye Near:    Bilateral Near:     Physical Exam Vitals and nursing note reviewed.  Constitutional:      Appearance: Normal appearance. She is not ill-appearing.  HENT:     Head: Normocephalic and atraumatic.   Musculoskeletal:        General: Tenderness present. No swelling or deformity.  Skin:    General: Skin is warm and dry.     Capillary Refill: Capillary refill takes less than 2 seconds.     Findings: Bruising and erythema present.  Neurological:     General: No focal deficit present.     Mental Status: She is alert and oriented to person, place, and time.     Sensory: No sensory deficit.     Motor: No weakness.  Psychiatric:        Mood and Affect: Mood normal.        Behavior: Behavior normal.        Thought Content: Thought content normal.        Judgment: Judgment normal.     UC Treatments / Results  Labs (all labs ordered are listed, but only abnormal results are displayed) Labs Reviewed - No data to display  EKG   Radiology DG Toe 4th Left  Result Date: 08/25/2021 CLINICAL DATA:  Left fourth toe injury last night. EXAM: LEFT FOURTH TOE COMPARISON:  None. FINDINGS: Acute nondisplaced oblique fracture of the fourth proximal phalanx. No intra-articular extension. No dislocation. Joint spaces are preserved. Bone mineralization is normal. Soft tissue swelling of fourth toe. IMPRESSION: 1. Acute nondisplaced fourth proximal phalanx fracture. Electronically Signed   By: Obie Dredge M.D.   On: 08/25/2021 09:16    Procedures Procedures (including critical care time)  Medications Ordered in UC Medications  ketorolac (TORADOL) injection 60 mg (60 mg Intramuscular Given 08/25/21 0928)    Initial Impression / Assessment and Plan / UC Course  I have reviewed the triage vital signs and the nursing notes.  Pertinent labs & imaging results that were available during my care of the patient were reviewed by me and considered in my medical decision making (see chart for details).  Patient is a very pleasant, nontoxic-appearing 41 year old female here for evaluation of left fourth toe pain after striking a piece of furniture.  She has limited range of motion but she has good  sensation on exam.  The toe is in normal anatomical alignment.  There is some faint bruising across the proximal phalanx and there is some crepitus when palpating the proximal phalanx and the MTP joint of the fourth toe.  There is no tenderness with palpating the distal phalanx or the DIP joint.  Radiographs of left fourth toe acquired at triage.  Left fourth toe x-ray independently reviewed and evaluated by me.  Impression: There is an oblique fracture through the shaft of the proximal phalanx of the left fourth toe without significant displacement.  Radiology overread is pending. Radiology interpretation is acute, nondisplaced fourth proximal phalanx fracture.  Given that the fracture is nondisplaced per the radiology read I believe buddy taping the third and fourth toe and placing patient in a hard sole shoe will suffice.  I have contacted Dr. Nicholes RoughKevin Patel through secure epic chat as he is the unassigned podiatrist on-call for any additional suggestions he might have.  Is in agreement with the plan of buddy taping and postop shoe.  He states that he will see her in the office in 6 weeks.  I discussed this with the patient and she is in agreement.  I will discharge her home on Toradol for mild to moderate pain and tramadol as needed for severe pain.  I have advised her to keep her foot elevated as much as possible and minimize weightbearing.  I have suggested that when she sleeps at night that she takes in 1 pillow and ties it around her foot and ankle with scars are old stockings so that her foot always remains elevated and it is protected from additional injury.   Final Clinical Impressions(s) / UC Diagnoses   Final diagnoses:  Closed nondisplaced fracture of proximal phalanx of lesser toe of left foot, initial encounter     Discharge Instructions      Wear the hard soled shoe at all times when you are up and moving.  You may take it off when you go to bed at night and to bathe.  Keep your  left foot elevate as much as possible to minimize swelling and aid in pain relief.  Decrease in the swelling also improves blood flow which will improve healing.  You can apply ice to your toe for 20 minutes at a time 2-3 times a day as needed for pain and swelling.  Do not apply the ice directly to your skin always make sure there is a cloth between the ice and your skin.  Take the Toradol every 6 hours as needed for mild to moderate pain.  Please take this with food.  You may use the tramadol for more severe pain.  It may make you drowsy so do not drink alcohol or drive if you take it.  You need to follow-up with Dr. Allena KatzPatel at [facility] in 6 weeks.  If you develop any increasing pain, swelling, red streaks going up your foot, or any additional injury please return for reevaluation.     ED Prescriptions     Medication Sig Dispense Auth. Provider   ketorolac (TORADOL) 10 MG tablet Take 1 tablet (10 mg total) by mouth every 6 (six) hours as needed. 20 tablet Becky Augustayan, Kindsey Eblin, NP   traMADol (ULTRAM) 50 MG tablet Take 2 tablets (100 mg total) by mouth every 6 (six) hours as needed. 25 tablet Becky Augustayan, Moksha Dorgan, NP      I have reviewed the PDMP during this encounter.   Becky Augustayan, Shaundrea Carrigg, NP 08/25/21 [phone]

## 2021-08-25 NOTE — Discharge Instructions (Addendum)
Wear the hard soled shoe at all times when you are up and moving.  You may take it off when you go to bed at night and to bathe.  Keep your left foot elevate as much as possible to minimize swelling and aid in pain relief.  Decrease in the swelling also improves blood flow which will improve healing.  You can apply ice to your toe for 20 minutes at a time 2-3 times a day as needed for pain and swelling.  Do not apply the ice directly to your skin always make sure there is a cloth between the ice and your skin.  Take the Toradol every 6 hours as needed for mild to moderate pain.  Please take this with food.  You may use the tramadol for more severe pain.  It may make you drowsy so do not drink alcohol or drive if you take it.  You need to follow-up with Dr. Allena Katz at [facility] in 6 weeks.  If you develop any increasing pain, swelling, red streaks going up your foot, or any additional injury please return for reevaluation.

## 2021-08-25 NOTE — ED Triage Notes (Addendum)
Pt reports pain and swelling in the 4th left toe. States she hit the left 4th toe with the bed last night. Pt reports left 4th toe is bend to the side.

## 2021-09-23 ENCOUNTER — Encounter: Payer: Self-pay | Admitting: *Deleted

## 2021-09-26 ENCOUNTER — Ambulatory Visit: Payer: Medicaid Other | Admitting: Registered Nurse

## 2021-09-26 ENCOUNTER — Encounter
Admission: RE | Disposition: A | Discharge status: Home / Self Care | Payer: Self-pay | Source: Home / Self Care | Attending: Gastroenterology

## 2021-09-26 ENCOUNTER — Encounter: Payer: Self-pay | Admitting: *Deleted

## 2021-09-26 ENCOUNTER — Other Ambulatory Visit: Payer: Self-pay

## 2021-09-26 ENCOUNTER — Ambulatory Visit
Admission: RE | Admit: 2021-09-26 | Discharge: 2021-09-26 | Disposition: A | Discharge status: Home / Self Care | Payer: Medicaid Other | Attending: Gastroenterology | Admitting: Gastroenterology

## 2021-09-26 DIAGNOSIS — K5792 Diverticulitis of intestine, part unspecified, without perforation or abscess without bleeding: Secondary | ICD-10-CM | POA: Insufficient documentation

## 2021-09-26 DIAGNOSIS — K640 First degree hemorrhoids: Secondary | ICD-10-CM | POA: Diagnosis not present

## 2021-09-26 DIAGNOSIS — K5730 Diverticulosis of large intestine without perforation or abscess without bleeding: Secondary | ICD-10-CM | POA: Insufficient documentation

## 2021-09-26 DIAGNOSIS — E785 Hyperlipidemia, unspecified: Secondary | ICD-10-CM | POA: Insufficient documentation

## 2021-09-26 DIAGNOSIS — E669 Obesity, unspecified: Secondary | ICD-10-CM | POA: Insufficient documentation

## 2021-09-26 DIAGNOSIS — K6389 Other specified diseases of intestine: Secondary | ICD-10-CM | POA: Insufficient documentation

## 2021-09-26 DIAGNOSIS — Z6837 Body mass index (BMI) 37.0-37.9, adult: Secondary | ICD-10-CM | POA: Diagnosis not present

## 2021-09-26 DIAGNOSIS — Z87891 Personal history of nicotine dependence: Secondary | ICD-10-CM | POA: Diagnosis not present

## 2021-09-26 HISTORY — DX: Hyperlipidemia, unspecified: E78.5

## 2021-09-26 HISTORY — PX: COLONOSCOPY: SHX5424

## 2021-09-26 SURGERY — COLONOSCOPY
Anesthesia: General

## 2021-09-26 MED ORDER — PROPOFOL 10 MG/ML IV BOLUS
INTRAVENOUS | Status: DC | PRN
  Administered 2021-09-26: 80 mg via INTRAVENOUS

## 2021-09-26 MED ORDER — KETOROLAC TROMETHAMINE 30 MG/ML IJ SOLN
30.0000 mg | Freq: Once | INTRAMUSCULAR | Status: AC
  Administered 2021-09-26: 30 mg via INTRAVENOUS

## 2021-09-26 MED ORDER — PROPOFOL 500 MG/50ML IV EMUL
INTRAVENOUS | Status: DC | PRN
  Administered 2021-09-26: 140 ug/kg/min via INTRAVENOUS

## 2021-09-26 MED ORDER — SODIUM CHLORIDE 0.9 % IV SOLN: INTRAVENOUS | Status: DC

## 2021-09-26 NOTE — Anesthesia Postprocedure Evaluation (Signed)
Anesthesia Post Note  Patient: Tracy Nunez  Procedure(s) Performed: COLONOSCOPY  Patient location during evaluation: Endoscopy Anesthesia Type: General Level of consciousness: awake and alert Pain management: pain level controlled Vital Signs Assessment: post-procedure vital signs reviewed and stable Respiratory status: spontaneous breathing, nonlabored ventilation, respiratory function stable and patient connected to nasal cannula oxygen Cardiovascular status: blood pressure returned to baseline and stable Postop Assessment: no apparent nausea or vomiting Anesthetic complications: no   No notable events documented.   Last Vitals:  Vitals:   09/26/21 1417 09/26/21 1427  BP: 112/75 114/84  Pulse: 72 77  Resp: 12 13  Temp:    SpO2: 100% 100%    Last Pain:  Vitals:   09/26/21 1407  TempSrc:   PainSc: 6                  Lenard Simmer

## 2021-09-26 NOTE — Op Note (Signed)
[facility] Gastroenterology Patient Name: Tracy Nunez Procedure Date: 09/26/2021 1:28 PM MRN: [id number] Account #: [id number] Date of Birth: 1981/08/04 Admit Type: Outpatient Age: 41 Room: [facility] ENDO ROOM 3 Gender: Female Note Status: Finalized Instrument Name: Colonscope [id number] Procedure:             Colonoscopy Indications:           Follow-up of diverticulitis Providers:             Andrey Farmer MD, MD Referring MD:          Kathrine Haddock (Referring MD) Medicines:             Monitored Anesthesia Care Complications:         No immediate complications. Estimated blood loss:                         Minimal. Procedure:             Pre-Anesthesia Assessment:                        - Prior to the procedure, a History and Physical was                         performed, and patient medications and allergies were                         reviewed. The patient is competent. The risks and                         benefits of the procedure and the sedation options and                         risks were discussed with the patient. All questions                         were answered and informed consent was obtained.                         Patient identification and proposed procedure were                         verified by the physician, the nurse, the                         anesthesiologist, the anesthetist and the technician                         in the endoscopy suite. Mental Status Examination:                         alert and oriented. Airway Examination: normal                         oropharyngeal airway and neck mobility. Respiratory                         Examination: clear to auscultation. CV Examination:  normal. Prophylactic Antibiotics: The patient does not                         require prophylactic antibiotics. Prior                         Anticoagulants: The patient has taken no previous                          anticoagulant or antiplatelet agents. ASA Grade                         Assessment: I - A normal, healthy patient. After                         reviewing the risks and benefits, the patient was                         deemed in satisfactory condition to undergo the                         procedure. The anesthesia plan was to use monitored                         anesthesia care (MAC). Immediately prior to                         administration of medications, the patient was                         re-assessed for adequacy to receive sedatives. The                         heart rate, respiratory rate, oxygen saturations,                         blood pressure, adequacy of pulmonary ventilation, and                         response to care were monitored throughout the                         procedure. The physical status of the patient was                         re-assessed after the procedure.                        After obtaining informed consent, the colonoscope was                         passed under direct vision. Throughout the procedure,                         the patient's blood pressure, pulse, and oxygen                         saturations were monitored continuously. The  Colonoscope was introduced through the anus and                         advanced to the the cecum, identified by appendiceal                         orifice and ileocecal valve. The colonoscopy was                         performed without difficulty. The patient tolerated                         the procedure well. The quality of the bowel                         preparation was good. Findings:      The perianal and digital rectal examinations were normal.      Many small and large-mouthed diverticula were found in the sigmoid colon       and descending colon.      A localized area of moderately erythematous mucosa was found in the       sigmoid colon. Biopsies were taken with a  cold forceps for histology.       Estimated blood loss was minimal.      Internal hemorrhoids were found during retroflexion. The hemorrhoids       were Grade I (internal hemorrhoids that do not prolapse).      The exam was otherwise without abnormality on direct and retroflexion       views. Impression:            - Diverticulosis in the sigmoid colon and in the                         descending colon.                        - Erythematous mucosa in the sigmoid colon. Biopsied.                        - Internal hemorrhoids.                        - The examination was otherwise normal on direct and                         retroflexion views. Recommendation:        - Discharge patient to home.                        - Resume previous diet.                        - Continue present medications.                        - Await pathology results.                        - Repeat colonoscopy in 10 years for screening  purposes.                        - Return to referring physician as previously                         scheduled. Procedure Code(s):     --- Professional ---                        [phone], Colonoscopy, flexible; with biopsy, single or                         multiple Diagnosis Code(s):     --- Professional ---                        K64.0, First degree hemorrhoids                        K63.89, Other specified diseases of intestine                        K57.32, Diverticulitis of large intestine without                         perforation or abscess without bleeding                        K57.30, Diverticulosis of large intestine without                         perforation or abscess without bleeding CPT copyright 2019 American Medical Association. All rights reserved. The codes documented in this report are preliminary and upon coder review may  be revised to meet current compliance requirements. Andrey Farmer MD, MD 09/26/2021 1:53:27 PM Number of  Addenda: 0 Note Initiated On: 09/26/2021 1:28 PM Scope Withdrawal Time: 0 hours 10 minutes 45 seconds  Total Procedure Duration: 0 hours 15 minutes 34 seconds  Estimated Blood Loss:  Estimated blood loss was minimal.      [facility]

## 2021-09-26 NOTE — Anesthesia Preprocedure Evaluation (Signed)
Anesthesia Evaluation  Patient identified by MRN, date of birth, ID band Patient awake    Reviewed: Allergy & Precautions, H&P , NPO status , Patient's Chart, lab work & pertinent test results, reviewed documented beta blocker date and time   History of Anesthesia Complications Negative for: history of anesthetic complications  Airway Mallampati: II  TM Distance: >3 FB Neck ROM: full    Dental  (+) Dental Advidsory Given, Caps, Partial Lower, Teeth Intact   Pulmonary neg pulmonary ROS, former smoker,    Pulmonary exam normal breath sounds clear to auscultation       Cardiovascular Exercise Tolerance: Good negative cardio ROS Normal cardiovascular exam Rhythm:regular Rate:Normal     Neuro/Psych  Headaches, neg Seizures negative psych ROS   GI/Hepatic negative GI ROS, Neg liver ROS,   Endo/Other  negative endocrine ROS  Renal/GU negative Renal ROS  negative genitourinary   Musculoskeletal   Abdominal   Peds  Hematology negative hematology ROS (+)   Anesthesia Other Findings Past Medical History: No date: Diverticulitis No date: HLD (hyperlipidemia)  Obesity  Reproductive/Obstetrics negative OB ROS                             Anesthesia Physical Anesthesia Plan  ASA: 2  Anesthesia Plan: General   Post-op Pain Management:    Induction: Intravenous  PONV Risk Score and Plan: 3 and Propofol infusion and TIVA  Airway Management Planned: Natural Airway and Nasal Cannula  Additional Equipment:   Intra-op Plan:   Post-operative Plan:   Informed Consent: I have reviewed the patients History and Physical, chart, labs and discussed the procedure including the risks, benefits and alternatives for the proposed anesthesia with the patient or authorized representative who has indicated his/her understanding and acceptance.     Dental Advisory Given  Plan Discussed with:  Anesthesiologist, CRNA and Surgeon  Anesthesia Plan Comments:         Anesthesia Quick Evaluation

## 2021-09-26 NOTE — H&P (Signed)
Outpatient short stay form Pre-procedure 09/26/2021  Regis Bill, MD  Primary Physician: Gabriel Cirri, NP  Reason for visit:  Hx of diverticulitis  History of present illness:    41 y/o lady with two episodes of diverticulitis with most recent episode in September of last year here for colonoscopy. No blood thinners. No family history of GI malignancies. No abdominal surgeries. No abdominal pain at this time.    Current Facility-Administered Medications:    0.9 %  sodium chloride infusion, , Intravenous, Continuous, Piers Baade, Rossie Muskrat, MD, Last Rate: 20 mL/hr at 09/26/21 1310, New Bag at 09/26/21 1310  Medications Prior to Admission  Medication Sig Dispense Refill Last Dose   gemfibrozil (LOPID) 600 MG tablet Take 600 mg by mouth 2 (two) times daily.   09/25/2021   norethindrone (MICRONOR) 0.35 MG tablet Take 1 tablet by mouth every morning.   09/25/2021   ketorolac (TORADOL) 10 MG tablet Take 1 tablet (10 mg total) by mouth every 6 (six) hours as needed. 20 tablet 0    Multiple Vitamin (MULTIVITAMIN) tablet Take 1 tablet by mouth daily.      traMADol (ULTRAM) 50 MG tablet Take 2 tablets (100 mg total) by mouth every 6 (six) hours as needed. 25 tablet 0      No Known Allergies   Past Medical History:  Diagnosis Date   Diverticulitis    HLD (hyperlipidemia)     Review of systems:  Otherwise negative.    Physical Exam  Gen: Alert, oriented. Appears stated age.  HEENT: PERRLA. Lungs: No respiratory distress CV: RRR Abd: soft, benign, no masses Ext: No edema    Planned procedures: Proceed with colonoscopy. The patient understands the nature of the planned procedure, indications, risks, alternatives and potential complications including but not limited to bleeding, infection, perforation, damage to internal organs and possible oversedation/side effects from anesthesia. The patient agrees and gives consent to proceed.  Please refer to procedure notes for findings,  recommendations and patient disposition/instructions.     Regis Bill, MD [facility] Gastroenterology

## 2021-09-26 NOTE — Transfer of Care (Signed)
Immediate Anesthesia Transfer of Care Note  Patient: Tracy Nunez  Procedure(s) Performed: COLONOSCOPY  Patient Location: PACU  Anesthesia Type:General  Level of Consciousness: awake, alert  and oriented  Airway & Oxygen Therapy: Patient Spontanous Breathing  Post-op Assessment: Report given to RN and Post -op Vital signs reviewed and stable  Post vital signs: Reviewed and stable  Last Vitals:  Vitals Value Taken Time  BP    Temp    Pulse 69 09/26/21 1358  Resp 17 09/26/21 1358  SpO2 98 % 09/26/21 1358  Vitals shown include unvalidated device data.  Last Pain:  Vitals:   09/26/21 1249  TempSrc: Temporal  PainSc: 0-No pain         Complications: No notable events documented.

## 2021-09-26 NOTE — Interval H&P Note (Signed)
History and Physical Interval Note:  09/26/2021 1:29 PM  Tracy Nunez  has presented today for surgery, with the diagnosis of ABNORMAL CT SCAN OF GI TRACT.  The various methods of treatment have been discussed with the patient and family. After consideration of risks, benefits and other options for treatment, the patient has consented to  Procedure(s): COLONOSCOPY (N/A) as a surgical intervention.  The patient's history has been reviewed, patient examined, no change in status, stable for surgery.  I have reviewed the patient's chart and labs.  Questions were answered to the patient's satisfaction.     Regis Bill  Ok to proceed with colonoscopy

## 2021-09-27 LAB — SURGICAL PATHOLOGY

## 2021-12-12 ENCOUNTER — Ambulatory Visit
Admission: EM | Admit: 2021-12-12 | Discharge: 2021-12-12 | Disposition: A | Discharge status: Home / Self Care | Payer: Medicaid Other | Attending: Emergency Medicine | Admitting: Emergency Medicine

## 2021-12-12 DIAGNOSIS — N760 Acute vaginitis: Secondary | ICD-10-CM | POA: Insufficient documentation

## 2021-12-12 DIAGNOSIS — B3731 Acute candidiasis of vulva and vagina: Secondary | ICD-10-CM | POA: Insufficient documentation

## 2021-12-12 DIAGNOSIS — B9689 Other specified bacterial agents as the cause of diseases classified elsewhere: Secondary | ICD-10-CM | POA: Insufficient documentation

## 2021-12-12 LAB — WET PREP, GENITAL
Sperm: NONE SEEN
Trich, Wet Prep: NONE SEEN
WBC, Wet Prep HPF POC: <10 — AB (ref <10)

## 2021-12-12 LAB — URINALYSIS, ROUTINE W REFLEX MICROSCOPIC
Bilirubin Urine: NEGATIVE
Glucose, UA: NEGATIVE mg/dL
Hgb urine dipstick: NEGATIVE
Ketones, ur: NEGATIVE mg/dL
Leukocytes,Ua: NEGATIVE
Nitrite: NEGATIVE
Protein, ur: NEGATIVE mg/dL
Specific Gravity, Urine: 1.01 (ref 1.005–1.030)
pH: 6.5 (ref 5.0–8.0)

## 2021-12-12 MED ORDER — METRONIDAZOLE 500 MG PO TABS: 500.0000 mg | ORAL_TABLET | Freq: Two times a day (BID) | ORAL | 0 refills | Status: DC

## 2021-12-12 MED ORDER — FLUCONAZOLE 150 MG PO TABS: 150.0000 mg | ORAL_TABLET | Freq: Every day | ORAL | 0 refills | Status: AC

## 2021-12-12 NOTE — Discharge Instructions (Signed)
Today you are being treated for  Bacterial vaginosis and yeast  ? ?Your urinalysis was negative ? ?Take Metronidazole 500 mg twice a day for 7 days, do not drink alcohol while using medication, this will make you feel sick  ? ?Take Diflucan on the first day that you receive your medication then take second dose the day after you complete your antibiotics ? ?Bacterial vaginosis which results from an overgrowth of one on several organisms that are normally present in your vagina. Vaginosis is an inflammation of the vagina that can result in discharge, itching and pain. ? ?Yeast infections which are caused by a naturally occurring fungus called candida. Vaginosis is an inflammation of the vagina that can result in discharge, itching and pain. The cause is usually a change in the normal balance of vaginal bacteria or an infection. Vaginosis can also result from reduced estrogen levels after menopause. ? ? ?In addition: ?  ?Avoid baths, hot tubs and whirlpool spas.  ?Don't use scented or harsh soaps, such as those with deodorant or antibacterial action. ?Avoid irritants. These include scented tampons and pads. ?Wipe from front to back after using the toilet.  ?Don't douche. Your vagina doesn't require cleansing other than normal bathing.  ?Use a  condom. ?Wear cotton underwear, this fabric helps absorb moisture  ? ? ?

## 2021-12-12 NOTE — ED Triage Notes (Signed)
Pt c/o abdominal pain "around the belly button" and bloating. Pt states that the pain has traveled down to her bladder. Pt states that she feels fatigued and that she "doesn't feel right".  ? ?Pt states that she feels this is worse than a UTI. Pt declines any urinary issues or odor, no vaginal burning, itching, or discharge.  ? ?Pt states that her stomach hurts when she pulls and that she feels constipated but had 2 bowel movements today.  ? ?Pt has a history of diverticulitis.  ?

## 2021-12-12 NOTE — ED Provider Notes (Signed)
?[facility] ? ? ? ?CSN: [id number] ?Arrival date & time: 12/12/21  0915 ? ? ?  ? ?History   ?Chief Complaint ?Chief Complaint  ?Patient presents with  ? Abdominal Pain  ? ? ?HPI ?Tracy Nunez is a 41 y.o. female.  ? ?Patient presents with periumbilical abdominal pain radiating down into the pelvic region causing bladder tenderness for 1 day.  Endorses a low-grade fever 1 day ago and associated abdominal bloating.  Tolerating food and liquids.  No known sick contacts.  History of diverticulitis.  Recent colonoscopy in 09/26/2021, negative for findings .  Denies urinary symptoms, vaginal symptoms, nausea, vomiting, diarrhea, burning indigestion, increased gas production.  Had similar symptoms 3 weeks ago with an associated episode of vomiting, resolved spontaneously. ? ?Past Medical History:  ?Diagnosis Date  ? Diverticulitis   ? HLD (hyperlipidemia)   ? ? ?Patient Active Problem List  ? Diagnosis Date Noted  ? Obesity (BMI 35.0-39.9 without comorbidity) 10/13/2016  ? Encounter for surveillance of contraceptive pills 09/06/2015  ? Migraine headache without aura 09/06/2015  ? Diverticulitis 09/06/2015  ? ? ?Past Surgical History:  ?Procedure Laterality Date  ? COLONOSCOPY N/A 09/26/2021  ? Procedure: COLONOSCOPY;  Surgeon: Regis Bill, MD;  Location: [facility] ENDOSCOPY;  Service: Endoscopy;  Laterality: N/A;  ? WISDOM TOOTH EXTRACTION    ? ? ?OB History   ?No obstetric history on file. ?  ? ? ? ?Home Medications   ? ?Prior to Admission medications   ?Medication Sig Start Date End Date Taking? Authorizing Provider  ?gemfibrozil (LOPID) 600 MG tablet Take 600 mg by mouth 2 (two) times daily. 03/14/21  Yes [provider]  ?ketorolac (TORADOL) 10 MG tablet Take 1 tablet (10 mg total) by mouth every 6 (six) hours as needed. 08/25/21  Yes Becky Augusta, NP  ?Multiple Vitamin (MULTIVITAMIN) tablet Take 1 tablet by mouth daily.   Yes [provider]  ?norethindrone (MICRONOR) 0.35 MG tablet Take 1  tablet by mouth every morning. 01/03/21 01/03/22 Yes [provider]  ?traMADol (ULTRAM) 50 MG tablet Take 2 tablets (100 mg total) by mouth every 6 (six) hours as needed. 08/25/21  Yes Becky Augusta, NP  ? ? ?Family History ?Family History  ?Problem Relation Age of Onset  ? Cancer Mother   ?     hodgkins lymphoma  ? Heart disease Father   ?     massive MI  ? Diabetes Father   ? Hypertension Father   ? Migraines Father   ? COPD Father   ? Emphysema Father   ? Migraines Sister   ? Migraines Brother   ? Stroke Maternal Grandmother   ? Migraines Daughter   ? Migraines Son   ? Migraines Brother   ? Migraines Brother   ? Migraines Brother   ? ? ?Social History ?Social History  ? ?Tobacco Use  ? Smoking status: Former  ?  Types: Cigarettes  ?  Quit date: 01/05/2006  ?  Years since quitting: 15.9  ? Smokeless tobacco: Never  ?Vaping Use  ? Vaping Use: Never used  ?Substance Use Topics  ? Alcohol use: No  ?  Comment: pt states maybe once every 3 months   ? Drug use: No  ? ? ? ?Allergies   ?Patient has no known allergies. ? ? ?Review of Systems ?Review of Systems  ?Constitutional:  Positive for fever. Negative for activity change, appetite change, chills, diaphoresis, fatigue and unexpected weight change.  ?Gastrointestinal:  Positive for  abdominal pain. Negative for abdominal distention, anal bleeding, blood in stool, constipation, diarrhea, nausea, rectal pain and vomiting.  ?Genitourinary:  Negative for decreased urine volume, difficulty urinating, dyspareunia, dysuria, enuresis, flank pain, frequency, genital sores, hematuria, menstrual problem, pelvic pain, urgency, vaginal bleeding, vaginal discharge and vaginal pain.  ?Musculoskeletal: Negative.   ?Skin: Negative.   ? ? ?Physical Exam ?Triage Vital Signs ?ED Triage Vitals  ?Enc Vitals Group  ?   BP 12/12/21 0944 118/82  ?   Pulse Rate 12/12/21 0944 84  ?   Resp 12/12/21 0944 18  ?   Temp 12/12/21 0944 98.7 ?F (37.1 ?C)  ?   Temp Source 12/12/21 0944 Oral  ?   SpO2  12/12/21 0944 100 %  ?   Weight 12/12/21 0941 210 lb (95.3 kg)  ?   Height 12/12/21 0941 5\' 3"  (1.6 m)  ?   Head Circumference --   ?   Peak Flow --   ?   Pain Score 12/12/21 0938 6  ?   Pain Loc --   ?   Pain Edu? --   ?   Excl. in GC? --   ? ?No data found. ? ?Updated Vital Signs ?BP 118/82 (BP Location: Left Arm)   Pulse 84   Temp 98.7 ?F (37.1 ?C) (Oral)   Resp 18   Ht 5\' 3"  (1.6 m)   Wt 210 lb (95.3 kg)   LMP 11/28/2021   SpO2 100%   BMI 37.20 kg/m?  ? ?Visual Acuity ?Right Eye Distance:   ?Left Eye Distance:   ?Bilateral Distance:   ? ?Right Eye Near:   ?Left Eye Near:    ?Bilateral Near:    ? ?Physical Exam ?Constitutional:   ?   Appearance: Normal appearance. She is well-developed.  ?HENT:  ?   Head: Normocephalic.  ?Pulmonary:  ?   Effort: Pulmonary effort is normal.  ?Abdominal:  ?   General: Abdomen is flat. Bowel sounds are normal.  ?   Palpations: Abdomen is soft.  ?   Tenderness: There is abdominal tenderness in the suprapubic area. There is no right CVA tenderness, left CVA tenderness or guarding.  ?Skin: ?   General: Skin is warm and dry.  ?Neurological:  ?   Mental Status: She is alert and oriented to person, place, and time. Mental status is at baseline.  ?Psychiatric:     ?   Mood and Affect: Mood normal.     ?   Behavior: Behavior normal.  ? ? ? ?UC Treatments / Results  ?Labs ?(all labs ordered are listed, but only abnormal results are displayed) ?Labs Reviewed  ?WET PREP, GENITAL - Abnormal; Notable for the following components:  ?    Result Value  ? Yeast Wet Prep HPF POC PRESENT (*)   ? Clue Cells Wet Prep HPF POC PRESENT (*)   ? WBC, Wet Prep HPF POC <10 (*)   ? All other components within normal limits  ?URINALYSIS, ROUTINE W REFLEX MICROSCOPIC  ? ? ?EKG ? ? ?Radiology ?No results found. ? ?Procedures ?Procedures (including critical care time) ? ?Medications Ordered in UC ?Medications - No data to display ? ?Initial Impression / Assessment and Plan / UC Course  ?I have reviewed the  triage vital signs and the nursing notes. ? ?Pertinent labs & imaging results that were available during my care of the patient were reviewed by me and considered in my medical decision making (see chart for details). ? ?Bacterial vaginosis ?  Yeast vaginitis ? ?Confirmed via wet prep, negative for trichomoniasis, urinalysis negative, discussed exam findings with patient, will move forward with treatment of infection, metronidazole 7-day course prescribed as well as Diflucan, discussed administration, advised discontinuation of any alcohol usage while on medication, advised increase fluid intake through use of water, Tylenol and ibuprofen and good hygiene for additional management of symptoms, given strict precautions for persisting symptoms to follow-up with urgent care as needed ?Final Clinical Impressions(s) / UC Diagnoses  ? ?Final diagnoses:  ?None  ? ?Discharge Instructions   ?None ?  ? ?ED Prescriptions   ?None ?  ? ?PDMP not reviewed this encounter. ?  ?Valinda Hoar, NP ?12/12/21 1118 ? ?

## 2022-02-19 IMAGING — CR DG TOE 4TH 2+V*L*
3 series · 3 of 3 positions shown · non-contrast
Comparison: None.

CLINICAL DATA: Left fourth toe injury last night.

EXAM:
LEFT FOURTH TOE

[toe ap]
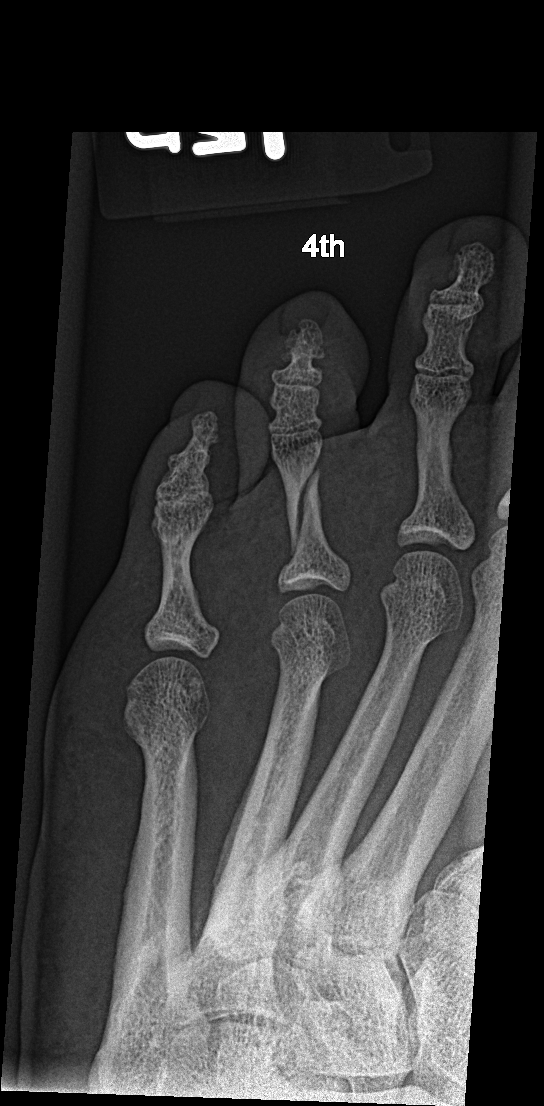

[toe obl]
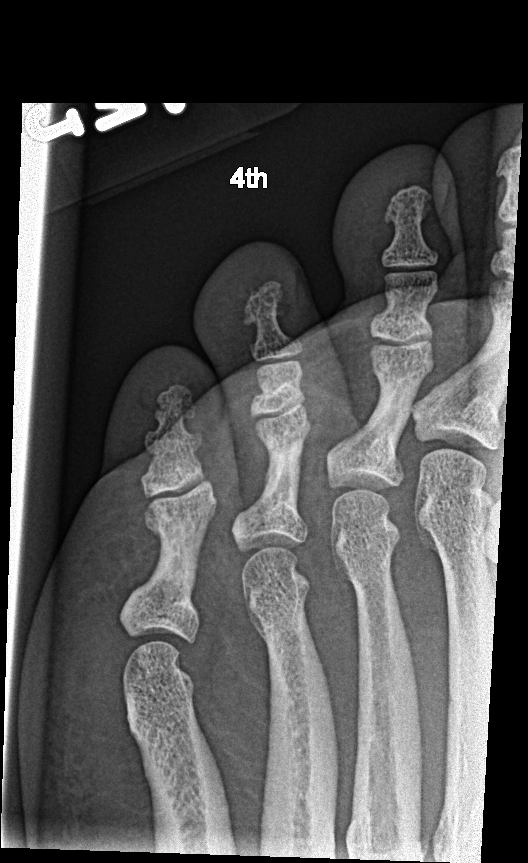

[toe lat]
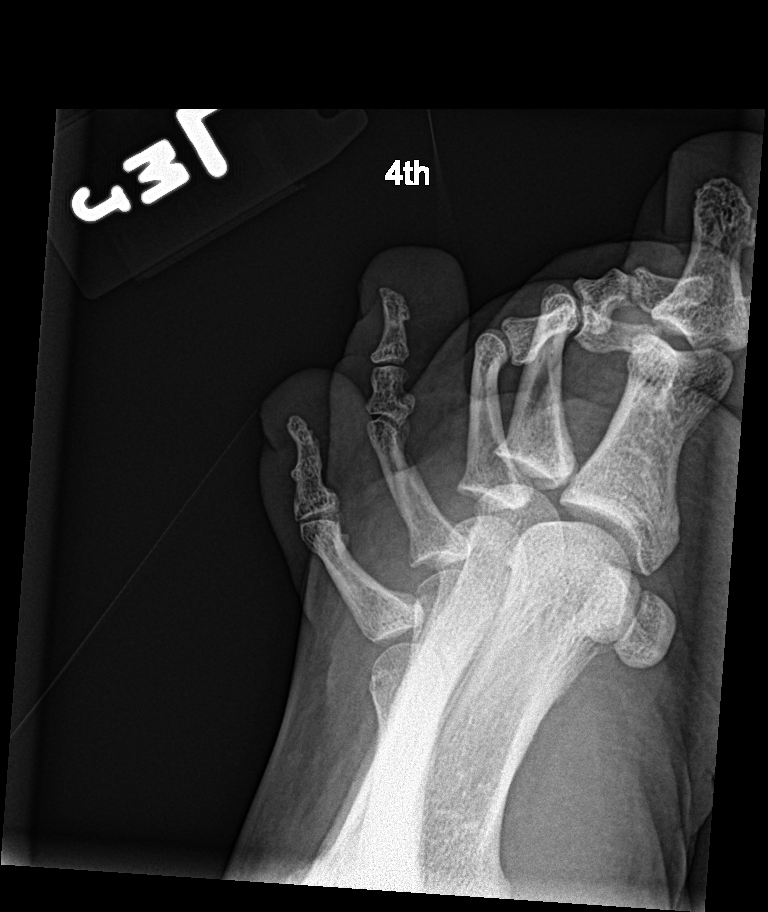

[3 of 3 positions shown; findings below may reference images not displayed]

FINDINGS: Acute nondisplaced oblique fracture of the fourth proximal phalanx.
No intra-articular extension. No dislocation. Joint spaces are
preserved. Bone mineralization is normal. Soft tissue swelling of
fourth toe.
IMPRESSION: 1. Acute nondisplaced fourth proximal phalanx fracture.

## 2023-04-18 ENCOUNTER — Ambulatory Visit
Admission: EM | Admit: 2023-04-18 | Discharge: 2023-04-18 | Disposition: A | Discharge status: Home / Self Care | Payer: Medicaid Other | Attending: Family Medicine | Admitting: Family Medicine

## 2023-04-18 ENCOUNTER — Other Ambulatory Visit: Payer: Self-pay

## 2023-04-18 DIAGNOSIS — Z1152 Encounter for screening for COVID-19: Secondary | ICD-10-CM | POA: Diagnosis not present

## 2023-04-18 DIAGNOSIS — J989 Respiratory disorder, unspecified: Secondary | ICD-10-CM | POA: Diagnosis present

## 2023-04-18 DIAGNOSIS — J209 Acute bronchitis, unspecified: Secondary | ICD-10-CM | POA: Insufficient documentation

## 2023-04-18 DIAGNOSIS — Z87891 Personal history of nicotine dependence: Secondary | ICD-10-CM | POA: Diagnosis not present

## 2023-04-18 LAB — GROUP A STREP BY PCR: Group A Strep by PCR: NOT DETECTED

## 2023-04-18 LAB — SARS CORONAVIRUS 2 BY RT PCR: SARS Coronavirus 2 by RT PCR: NEGATIVE

## 2023-04-18 MED ORDER — AMOXICILLIN-POT CLAVULANATE 875-125 MG PO TABS: 1.0000 | ORAL_TABLET | Freq: Two times a day (BID) | ORAL | 0 refills | Status: AC

## 2023-04-18 MED ORDER — PROMETHAZINE-DM 6.25-15 MG/5ML PO SYRP: 5.0000 mL | ORAL_SOLUTION | Freq: Four times a day (QID) | ORAL | 0 refills | Status: AC | PRN

## 2023-04-18 MED ORDER — PREDNISONE 20 MG PO TABS: 40.0000 mg | ORAL_TABLET | Freq: Every day | ORAL | 0 refills | Status: AC

## 2023-04-18 NOTE — ED Provider Notes (Addendum)
[facility]    CSN: [id number] Arrival date & time: 04/18/23  0808      History   Chief Complaint Chief Complaint  Patient presents with   Sore Throat   Generalized Body Aches   Otalgia    HPI Tracy Nunez is a 42 y.o. female.   HPI  History obtained from the patient. Tracy Nunez presents for sore throat, body aches and ear pain that started last Thursday at work.  The next day her symptoms got worse and had a fever. Tmax  100.3 F. Spitting up yellow-green mucus.  Coughing worse at night and threw up in the trash can after coughing so hard. Sore throat has improved.  Endorses headache. Last night, woke up in the middle of the night and her taste and smell felt off.  Took some DayQuil, Advil and Tylenol with some relief.  Had some diarrhea but this has resolved. No history of asthma. She quite smoking years ago.       Past Medical History:  Diagnosis Date   Diverticulitis    HLD (hyperlipidemia)     Patient Active Problem List   Diagnosis Date Noted   Obesity (BMI 35.0-39.9 without comorbidity) 10/13/2016   Encounter for surveillance of contraceptive pills 09/06/2015   Migraine headache without aura 09/06/2015   Diverticulitis 09/06/2015    Past Surgical History:  Procedure Laterality Date   COLONOSCOPY N/A 09/26/2021   Procedure: COLONOSCOPY;  Surgeon: Regis Bill, MD;  Location: [facility] ENDOSCOPY;  Service: Endoscopy;  Laterality: N/A;   WISDOM TOOTH EXTRACTION      OB History   No obstetric history on file.      Home Medications    Prior to Admission medications   Medication Sig Start Date End Date Taking? Authorizing Provider  amoxicillin-clavulanate (AUGMENTIN) 875-125 MG tablet Take 1 tablet by mouth every 12 (twelve) hours. 04/18/23  Yes Radiah Lubinski, DO  gemfibrozil (LOPID) 600 MG tablet Take 600 mg by mouth 2 (two) times daily. 03/14/21  Yes [provider]  Multiple Vitamin (MULTIVITAMIN) tablet Take 1 tablet by mouth daily.    Yes [provider]  norethindrone (MICRONOR) 0.35 MG tablet Take 1 tablet by mouth every morning. 01/03/21 04/18/23 Yes [provider]  predniSONE (DELTASONE) 20 MG tablet Take 2 tablets (40 mg total) by mouth daily for 5 days. 04/18/23 04/23/23 Yes Arieana Somoza, DO  promethazine-dextromethorphan (PROMETHAZINE-DM) 6.25-15 MG/5ML syrup Take 5 mLs by mouth 4 (four) times daily as needed. 04/18/23  Yes Katha Cabal, DO    Family History Family History  Problem Relation Age of Onset   Cancer Mother        hodgkins lymphoma   Heart disease Father        massive MI   Diabetes Father    Hypertension Father    Migraines Father    COPD Father    Emphysema Father    Migraines Sister    Migraines Brother    Stroke Maternal Grandmother    Migraines Daughter    Migraines Son    Migraines Brother    Migraines Brother    Migraines Brother     Social History Social History   Tobacco Use   Smoking status: Former    Current packs/day: 0.00    Types: Cigarettes    Quit date: 01/05/2006    Years since quitting: 17.2   Smokeless tobacco: Never  Vaping Use   Vaping status: Never Used  Substance Use Topics   Alcohol  use: No    Comment: pt states maybe once every 3 months    Drug use: No     Allergies   Patient has no known allergies.   Review of Systems Review of Systems: negative unless otherwise stated in HPI.      Physical Exam Triage Vital Signs ED Triage Vitals  Encounter Vitals Group     BP 04/18/23 0836 116/82     Systolic BP Percentile --      Diastolic BP Percentile --      Pulse Rate 04/18/23 0836 81     Resp 04/18/23 0836 18     Temp 04/18/23 0836 98.4 F (36.9 C)     Temp src --      SpO2 04/18/23 0836 97 %     Weight --      Height --      Head Circumference --      Peak Flow --      Pain Score 04/18/23 0832 6     Pain Loc --      Pain Education --      Exclude from Growth Chart --    No data found.  Updated Vital Signs BP  116/82   Pulse 81   Temp 98.4 F (36.9 C)   Resp 18   LMP 04/04/2023   SpO2 97%   Visual Acuity Right Eye Distance:   Left Eye Distance:   Bilateral Distance:    Right Eye Near:   Left Eye Near:    Bilateral Near:     Physical Exam GEN:     alert, ill but non-toxic appearing female in no distress    HENT:  mucus membranes moist, oropharyngeal without lesions, moderate erythema, no tonsillar hypertrophy or exudates,  moderate erythematous edematous turbinates, clear nasal discharge, bilateral TM normal EYES:   pupils equal and reactive, no scleral injection or discharge NECK:  normal ROM, anterior bilateral lymphadenopathy, no meningismus   RESP:  no increased work of breathing, intermittent rhonchi that clear with cough, no rales, no wheezing CVS:   regular rate and rhythm Skin:   warm and dry, no rash on visible skin    UC Treatments / Results  Labs (all labs ordered are listed, but only abnormal results are displayed) Labs Reviewed  SARS CORONAVIRUS 2 BY RT PCR  GROUP A STREP BY PCR    EKG   Radiology No results found.  Procedures Procedures (including critical care time)  Medications Ordered in UC Medications - No data to display  Initial Impression / Assessment and Plan / UC Course  I have reviewed the triage vital signs and the nursing notes.  Pertinent labs & imaging results that were available during my care of the patient were reviewed by me and considered in my medical decision making (see chart for details).       Pt is a 43 y.o. female who presents for over a week of respiratory symptoms. Tobie is afebrile here without recent antipyretics. Satting well on room air. Overall pt is ill but non-toxic appearing, well hydrated, without respiratory distress. Pulmonary exam is remarkable for intermittent rales that clear with cough.  Strep and COVID testing obtained and were negative.  Chest imaging deferred for now.  She is a former smoker.  Treat for  presumed bronchitis with Augmentin and prednisone burst as below.  Discussed symptomatic treatment.  Typical duration of symptoms discussed.  Promethazine DM for cough.  Return and ED precautions given and voiced  understanding. Discussed MDM, treatment plan and plan for follow-up with patient who agrees with plan.     Final Clinical Impressions(s) / UC Diagnoses   Final diagnoses:  Respiratory illness  Acute bronchitis, unspecified organism  Former cigar smoker     Discharge Instructions      Stop by the pharmacy to pick up your prescriptions.  Follow up with your primary care provider as needed.       ED Prescriptions     Medication Sig Dispense Auth. Provider   amoxicillin-clavulanate (AUGMENTIN) 875-125 MG tablet Take 1 tablet by mouth every 12 (twelve) hours. 10 tablet Stormy Sabol, DO   predniSONE (DELTASONE) 20 MG tablet Take 2 tablets (40 mg total) by mouth daily for 5 days. 10 tablet Kentrail Shew, DO   promethazine-dextromethorphan (PROMETHAZINE-DM) 6.25-15 MG/5ML syrup Take 5 mLs by mouth 4 (four) times daily as needed. 118 mL Katha Cabal, DO      PDMP not reviewed this encounter.       Katha Cabal, DO 04/18/23 1014

## 2023-04-18 NOTE — ED Triage Notes (Signed)
Pt c/o scratchy throat that started on Thursday then fever started on Saturday at 100.3, productive cough started on Sunday and last night pt started experiencing loss of taste and smell started last night. Pt states she would like a COVID test.

## 2023-04-18 NOTE — Discharge Instructions (Addendum)
Stop by the pharmacy to pick up your prescriptions.  Follow up with your primary care provider as needed.  

## 2023-07-23 ENCOUNTER — Ambulatory Visit
Admission: EM | Admit: 2023-07-23 | Discharge: 2023-07-23 | Disposition: A | Discharge status: Home / Self Care | Payer: Medicaid Other | Attending: Family Medicine | Admitting: Family Medicine

## 2023-07-23 DIAGNOSIS — U071 COVID-19: Secondary | ICD-10-CM

## 2023-07-23 MED ORDER — PAXLOVID (300/100) 20 X 150 MG & 10 X 100MG PO TBPK: 3.0000 | ORAL_TABLET | Freq: Two times a day (BID) | ORAL | 0 refills | Status: AC

## 2023-07-23 NOTE — ED Provider Notes (Signed)
[facility]    CSN: [id number] Arrival date & time: 07/23/23  4098      History   Chief Complaint Chief Complaint  Patient presents with   Covid Positive    HPI Tracy Nunez is a 42 y.o. female.   Patient came in with nasal congestion runny nose sneezing head congestion mild cough and scratchy throat.  Sudden onset.  Started 4 days ago.  Did a home COVID test.  It was positive.  Patient's son was COVID-positive last week.  Patient denies any shortness of breath.  Does feel on and off dizziness.  Feels slightly weak.  No high fever chills or body ache.  Has not taken any OTC medications.     Past Medical History:  Diagnosis Date   Diverticulitis    HLD (hyperlipidemia)     Patient Active Problem List   Diagnosis Date Noted   Obesity (BMI 35.0-39.9 without comorbidity) 10/13/2016   Encounter for surveillance of contraceptive pills 09/06/2015   Migraine headache without aura 09/06/2015   Diverticulitis 09/06/2015    Past Surgical History:  Procedure Laterality Date   COLONOSCOPY N/A 09/26/2021   Procedure: COLONOSCOPY;  Surgeon: Regis Bill, MD;  Location: [facility] ENDOSCOPY;  Service: Endoscopy;  Laterality: N/A;   WISDOM TOOTH EXTRACTION      OB History   No obstetric history on file.      Home Medications    Prior to Admission medications   Medication Sig Start Date End Date Taking? Authorizing Provider  nirmatrelvir/ritonavir (PAXLOVID, 300/100,) 20 x 150 MG & 10 x 100MG  TBPK Take 3 tablets by mouth 2 (two) times daily for 5 days. Take nirmatrelvir (150 mg) two tablets twice daily for 5 days and ritonavir (100 mg) one tablet twice daily for 5 days. 07/23/23 07/28/23 Yes Lura Em, MD  amoxicillin-clavulanate (AUGMENTIN) 875-125 MG tablet Take 1 tablet by mouth every 12 (twelve) hours. 04/18/23   Brimage, Seward Meth, DO  gemfibrozil (LOPID) 600 MG tablet Take 600 mg by mouth 2 (two) times daily. 03/14/21  Yes [provider]   Multiple Vitamin (MULTIVITAMIN) tablet Take 1 tablet by mouth daily.    [provider]  norethindrone (MICRONOR) 0.35 MG tablet Take 1 tablet by mouth every morning. 01/03/21 04/18/23  [provider]  promethazine-dextromethorphan (PROMETHAZINE-DM) 6.25-15 MG/5ML syrup Take 5 mLs by mouth 4 (four) times daily as needed. 04/18/23   Katha Cabal, DO    Family History Family History  Problem Relation Age of Onset   Cancer Mother        hodgkins lymphoma   Heart disease Father        massive MI   Diabetes Father    Hypertension Father    Migraines Father    COPD Father    Emphysema Father    Migraines Sister    Migraines Brother    Stroke Maternal Grandmother    Migraines Daughter    Migraines Son    Migraines Brother    Migraines Brother    Migraines Brother     Social History Social History   Tobacco Use   Smoking status: Former    Current packs/day: 0.00    Types: Cigarettes    Quit date: 01/05/2006    Years since quitting: 17.5   Smokeless tobacco: Never  Vaping Use   Vaping status: Never Used  Substance Use Topics   Alcohol use: No    Comment: pt states maybe once every 3 months    Drug  use: No     Allergies   Patient has no known allergies.   Review of Systems Review of Systems Negative other than mentioned in HPI.  Physical Exam Triage Vital Signs ED Triage Vitals [07/23/23 1011]  Encounter Vitals Group     BP (!) 110/95     Systolic BP Percentile      Diastolic BP Percentile      Pulse Rate 73     Resp 20     Temp 98.3 F (36.8 C)     Temp Source Oral     SpO2 99 %     Weight      Height      Head Circumference      Peak Flow      Pain Score 0     Pain Loc      Pain Education      Exclude from Growth Chart    No data found.  Updated Vital Signs BP (!) 110/95 (BP Location: Left Arm)   Pulse 73   Temp 98.3 F (36.8 C) (Oral)   Resp 20   SpO2 99%   Visual Acuity Right Eye Distance:   Left Eye Distance:    Bilateral Distance:    Right Eye Near:   Left Eye Near:    Bilateral Near:     Physical Exam Alert awake oriented to time place person no acute distress.  Head is atraumatic normocephalic.  Eyes EOMI PERRLA.  Oral mucosa moist.  Neck supple.  Throat clear.  Bilateral ear exam within normal limits.  S1-S2 heard no murmur no gallop.  No tachycardia noted.  Bilaterally present no wheezing or rhonchi.  No peripheral edema noted.  UC Treatments / Results  Labs (all labs ordered are listed, but only abnormal results are displayed) Labs Reviewed - No data to display  EKG   Radiology No results found.  Procedures Procedures (including critical care time)  Medications Ordered in UC Medications - No data to display  Initial Impression / Assessment and Plan / UC Course  I have reviewed the triage vital signs and the nursing notes.  Pertinent labs & imaging results that were available during my care of the patient were reviewed by me and considered in my medical decision making (see chart for details).      Final Clinical Impressions(s) / UC Diagnoses   Final diagnoses:  COVID-19   Discharge Instructions   None    ED Prescriptions     Medication Sig Dispense Auth. Provider   nirmatrelvir/ritonavir (PAXLOVID, 300/100,) 20 x 150 MG & 10 x 100MG  TBPK Take 3 tablets by mouth 2 (two) times daily for 5 days. Take nirmatrelvir (150 mg) two tablets twice daily for 5 days and ritonavir (100 mg) one tablet twice daily for 5 days. 30 tablet Lura Em, MD      PDMP not reviewed this encounter.   Lura Em, MD 07/23/23 1045

## 2023-07-23 NOTE — ED Triage Notes (Addendum)
Patient tested positive for covid sat.   Lightheaded Runny nose Cough  Dayquil Hot tea with lemon and honey
